# Patient Record
Sex: Male | Born: 1965 | Race: White | Hispanic: No | Marital: Single | Smoking: Current every day smoker
Health system: Southern US, Community
[De-identification: ages and names within clinical notes are randomized; demographics above are authoritative.]

## PROBLEM LIST (undated history)

## (undated) DIAGNOSIS — N486 Induration penis plastica: Secondary | ICD-10-CM

## (undated) DIAGNOSIS — K648 Other hemorrhoids: Secondary | ICD-10-CM

## (undated) DIAGNOSIS — E669 Obesity, unspecified: Secondary | ICD-10-CM

## (undated) DIAGNOSIS — I10 Essential (primary) hypertension: Secondary | ICD-10-CM

## (undated) DIAGNOSIS — N200 Calculus of kidney: Secondary | ICD-10-CM

## (undated) DIAGNOSIS — N400 Enlarged prostate without lower urinary tract symptoms: Secondary | ICD-10-CM

## (undated) DIAGNOSIS — E119 Type 2 diabetes mellitus without complications: Secondary | ICD-10-CM

## (undated) DIAGNOSIS — R197 Diarrhea, unspecified: Secondary | ICD-10-CM

## (undated) DIAGNOSIS — G4733 Obstructive sleep apnea (adult) (pediatric): Secondary | ICD-10-CM

## (undated) DIAGNOSIS — I700 Atherosclerosis of aorta: Secondary | ICD-10-CM

## (undated) DIAGNOSIS — K760 Fatty (change of) liver, not elsewhere classified: Secondary | ICD-10-CM

## (undated) DIAGNOSIS — K5790 Diverticulosis of intestine, part unspecified, without perforation or abscess without bleeding: Secondary | ICD-10-CM

## (undated) DIAGNOSIS — D126 Benign neoplasm of colon, unspecified: Secondary | ICD-10-CM

## (undated) HISTORY — PX: BILATERAL CARPAL TUNNEL RELEASE: SHX6508

## (undated) HISTORY — DX: Essential (primary) hypertension: I10

## (undated) HISTORY — DX: Other hemorrhoids: K64.8

## (undated) HISTORY — DX: Diverticulosis of intestine, part unspecified, without perforation or abscess without bleeding: K57.90

## (undated) HISTORY — DX: Benign neoplasm of colon, unspecified: D12.6

## (undated) HISTORY — DX: Atherosclerosis of aorta: I70.0

## (undated) HISTORY — DX: Calculus of kidney: N20.0

## (undated) HISTORY — DX: Benign prostatic hyperplasia without lower urinary tract symptoms: N40.0

---

## 2004-11-27 ENCOUNTER — Ambulatory Visit: Payer: Self-pay | Admitting: Family Medicine

## 2005-01-25 ENCOUNTER — Ambulatory Visit: Payer: Self-pay | Admitting: Family Medicine

## 2005-02-01 ENCOUNTER — Ambulatory Visit: Payer: Self-pay | Admitting: Family Medicine

## 2006-11-30 DIAGNOSIS — F411 Generalized anxiety disorder: Secondary | ICD-10-CM | POA: Insufficient documentation

## 2011-06-11 DIAGNOSIS — R197 Diarrhea, unspecified: Secondary | ICD-10-CM

## 2011-06-11 HISTORY — PX: COLONOSCOPY: SHX174

## 2011-06-11 HISTORY — DX: Diarrhea, unspecified: R19.7

## 2012-09-20 DIAGNOSIS — N486 Induration penis plastica: Secondary | ICD-10-CM | POA: Insufficient documentation

## 2013-04-12 DIAGNOSIS — G4733 Obstructive sleep apnea (adult) (pediatric): Secondary | ICD-10-CM

## 2013-04-12 HISTORY — DX: Obstructive sleep apnea (adult) (pediatric): G47.33

## 2015-01-25 ENCOUNTER — Ambulatory Visit (INDEPENDENT_AMBULATORY_CARE_PROVIDER_SITE_OTHER): Payer: 59 | Admitting: Family Medicine

## 2015-01-25 VITALS — BP 126/88 | HR 80 | Temp 98.3°F | Resp 16 | Ht 72.0 in | Wt 246.0 lb

## 2015-01-25 DIAGNOSIS — I10 Essential (primary) hypertension: Secondary | ICD-10-CM | POA: Insufficient documentation

## 2015-01-25 DIAGNOSIS — N529 Male erectile dysfunction, unspecified: Secondary | ICD-10-CM | POA: Insufficient documentation

## 2015-01-25 DIAGNOSIS — M5431 Sciatica, right side: Secondary | ICD-10-CM

## 2015-01-25 MED ORDER — OXYCODONE-ACETAMINOPHEN 5-325 MG PO TABS: 1.0000 | ORAL_TABLET | Freq: Three times a day (TID) | ORAL | Status: DC | PRN

## 2015-01-25 MED ORDER — PREDNISONE 20 MG PO TABS: 60.0000 mg | ORAL_TABLET | Freq: Every day | ORAL | Status: DC

## 2015-01-25 NOTE — Patient Instructions (Addendum)
Let me know if you're not improving by Monday afternoon    Sciatica Sciatica is pain, weakness, numbness, or tingling along the path of the sciatic nerve. The nerve starts in the lower back and runs down the back of each leg. The nerve controls the muscles in the lower leg and in the back of the knee, while also providing sensation to the back of the thigh, lower leg, and the sole of your foot. Sciatica is a symptom of another medical condition. For instance, nerve damage or certain conditions, such as a herniated disk or bone spur on the spine, pinch or put pressure on the sciatic nerve. This causes the pain, weakness, or other sensations normally associated with sciatica. Generally, sciatica only affects one side of the body. CAUSES   Herniated or slipped disc.  Degenerative disk disease.  A pain disorder involving the narrow muscle in the buttocks (piriformis syndrome).  Pelvic injury or fracture.  Pregnancy.  Tumor (rare). SYMPTOMS  Symptoms can vary from mild to very severe. The symptoms usually travel from the low back to the buttocks and down the back of the leg. Symptoms can include:  Mild tingling or dull aches in the lower back, leg, or hip.  Numbness in the back of the calf or sole of the foot.  Burning sensations in the lower back, leg, or hip.  Sharp pains in the lower back, leg, or hip.  Leg weakness.  Severe back pain inhibiting movement. These symptoms may get worse with coughing, sneezing, laughing, or prolonged sitting or standing. Also, being overweight may worsen symptoms. DIAGNOSIS  Your caregiver will perform a physical exam to look for common symptoms of sciatica. He or she may ask you to do certain movements or activities that would trigger sciatic nerve pain. Other tests may be performed to find the cause of the sciatica. These may include:  Blood tests.  X-rays.  Imaging tests, such as an MRI or CT scan. TREATMENT  Treatment is directed at the  cause of the sciatic pain. Sometimes, treatment is not necessary and the pain and discomfort goes away on its own. If treatment is needed, your caregiver may suggest:  Over-the-counter medicines to relieve pain.  Prescription medicines, such as anti-inflammatory medicine, muscle relaxants, or narcotics.  Applying heat or ice to the painful area.  Steroid injections to lessen pain, irritation, and inflammation around the nerve.  Reducing activity during periods of pain.  Exercising and stretching to strengthen your abdomen and improve flexibility of your spine. Your caregiver may suggest losing weight if the extra weight makes the back pain worse.  Physical therapy.  Surgery to eliminate what is pressing or pinching the nerve, such as a bone spur or part of a herniated disk. HOME CARE INSTRUCTIONS   Only take over-the-counter or prescription medicines for pain or discomfort as directed by your caregiver.  Apply ice to the affected area for 20 minutes, 3-4 times a day for the first 48-72 hours. Then try heat in the same way.  Exercise, stretch, or perform your usual activities if these do not aggravate your pain.  Attend physical therapy sessions as directed by your caregiver.  Keep all follow-up appointments as directed by your caregiver.  Do not wear high heels or shoes that do not provide proper support.  Check your mattress to see if it is too soft. A firm mattress may lessen your pain and discomfort. SEEK IMMEDIATE MEDICAL CARE IF:   You lose control of your bowel or  bladder (incontinence).  You have increasing weakness in the lower back, pelvis, buttocks, or legs.  You have redness or swelling of your back.  You have a burning sensation when you urinate.  You have pain that gets worse when you lie down or awakens you at night.  Your pain is worse than you have experienced in the past.  Your pain is lasting longer than 4 weeks.  You are suddenly losing weight  without reason. MAKE SURE YOU:  Understand these instructions.  Will watch your condition.  Will get help right away if you are not doing well or get worse.   This information is not intended to replace advice given to you by your health care provider. Make sure you discuss any questions you have with your health care provider.   Document Released: 03/23/2001 Document Revised: 12/18/2014 Document Reviewed: 08/08/2011 Elsevier Interactive Patient Education [employer].

## 2015-01-25 NOTE — Progress Notes (Signed)
@UMFCLOGO @  This chart was scribed for Robyn Haber, MD by Thea Alken, ED Scribe. This patient was seen in room 12 and the patient's care was started at 2:23 PM.  Patient ID: Steve Ritter MRN: [id number], DOB: 1965/06/07, 49 y.o. Date of Encounter: 01/25/2015, 2:23 PM  Primary Physician: No primary care provider on file.  Chief Complaint:  Chief Complaint  Patient presents with   Back Pain    x 10 days/ middle right lower back    HPI: 49 y.o. year old male with history below presents with right lower back pain. Pt states he was diagnosed with DDD that flares up every once in a while. 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 Since then, he's had gradually worsening right low back pain. He was seen by chiropractor 3 times this week without relief to pain. He has radiation of pain to right buttock and down right leg with occasional numbness. States he has not been able to sleep due to pain.    Past Medical History  Diagnosis Date   Hypertension      Home Meds: Prior to Admission medications   Medication Sig Start Date End Date Taking? Authorizing Provider  amLODipine (NORVASC) 5 MG tablet Take 5 mg by mouth daily.   Yes Historical Provider, MD  omeprazole (PRILOSEC) 10 MG capsule Take 10 mg by mouth daily.   Yes Historical Provider, MD  sildenafil (REVATIO) 20 MG tablet Take 20 mg by mouth 3 (three) times daily.   Yes Historical Provider, MD    Allergies:  Allergies  Allergen Reactions   Codeine    Erythromycin    Penicillins     Social History   Social History   Marital Status: Single    Spouse Name: N/A   Number of Children: N/A   Years of Education: N/A   Occupational History   Not on file.   Social History Main Topics   Smoking status: Former Smoker   Smokeless tobacco: Not on file   Alcohol Use: Not on file   Drug Use: Not on file   Sexual Activity: Not on file   Other Topics Concern   Not on  file   Social History Narrative   No narrative on file    Review of Systems: Constitutional: negative for chills, fever, night sweats, weight changes, or fatigue  HEENT: negative for vision changes, hearing loss, congestion, rhinorrhea, ST, epistaxis, or sinus pressure Cardiovascular: negative for chest pain or palpitations Respiratory: negative for hemoptysis, wheezing, shortness of breath, or cough Abdominal: negative for abdominal pain, nausea, vomiting, diarrhea, or constipation Dermatological: negative for rash Neurologic: negative for headache, dizziness, or syncope All other systems reviewed and are otherwise negative with the exception to those above and in the HPI.   Physical Exam: Blood pressure 126/88, pulse 80, temperature 98.3 F (36.8 C), temperature source Oral, resp. rate 16, height 6' (1.829 m), weight 246 lb (111.585 kg), SpO2 99 %., Body mass index is 33.36 kg/(m^2). General: Well developed, well nourished, in no acute distress. Head: Normocephalic, atraumatic, eyes without discharge, sclera non-icteric, nares are without discharge. Bilateral auditory canals clear, TM's are without perforation, pearly grey and translucent with reflective cone of light bilaterally. Oral cavity moist, posterior pharynx without exudate, erythema, peritonsillar abscess, or post nasal drip.  Neck: Supple. No thyromegaly. Full ROM. No lymphadenopathy. Lungs: Clear bilaterally to auscultation without wheezes, rales, or rhonchi. Breathing is unlabored. Heart:  RRR with S1 S2. No murmurs, rubs, or gallops appreciated. Abdomen: Soft, non-tender, non-distended with normoactive bowel sounds. No hepatomegaly. No rebound/guarding. No obvious abdominal masses. Msk:  Strength and tone normal for age. Extremities/Skin: Warm and dry. No clubbing or cyanosis. No edema. No rashes or suspicious lesions. Neuro: Alert and oriented X 3. Moves all extremities spontaneously. Gait is normal. CNII-XII grossly in  tact. Psych:  Responds to questions appropriately with a normal affect.  Positive straight leg raising on right, good reflexes in ankles and knees bilaterally, minimal tenderness in the right L5 lateral supra-sacral area   ASSESSMENT AND PLAN:  49 y.o. year old male with  This chart was scribed in my presence and reviewed by me personally.    ICD-9-CM ICD-10-CM   1. Sciatica of right side 724.3 M54.31 oxyCODONE-acetaminophen (ROXICET) 5-325 MG tablet     predniSONE (DELTASONE) 20 MG tablet  2. Essential hypertension 401.9 I10   3. Erectile dysfunction, unspecified erectile dysfunction type 607.84 N52.9      Signed, Robyn Haber, MD   Signed, Robyn Haber, MD 01/25/2015 2:23 PM

## 2015-08-11 DIAGNOSIS — K760 Fatty (change of) liver, not elsewhere classified: Secondary | ICD-10-CM

## 2015-08-11 DIAGNOSIS — E669 Obesity, unspecified: Secondary | ICD-10-CM

## 2015-08-11 HISTORY — DX: Obesity, unspecified: E66.9

## 2015-08-11 HISTORY — DX: Fatty (change of) liver, not elsewhere classified: K76.0

## 2015-08-17 ENCOUNTER — Inpatient Hospital Stay (HOSPITAL_COMMUNITY)
Admission: EM | Admit: 2015-08-17 | Discharge: 2015-08-22 | DRG: 439 | Disposition: A | Discharge status: Home / Self Care | Payer: 59 | Attending: Internal Medicine | Admitting: Internal Medicine

## 2015-08-17 ENCOUNTER — Encounter (HOSPITAL_COMMUNITY): Payer: Self-pay | Admitting: *Deleted

## 2015-08-17 ENCOUNTER — Emergency Department (HOSPITAL_COMMUNITY): Payer: 59

## 2015-08-17 DIAGNOSIS — E872 Acidosis: Secondary | ICD-10-CM | POA: Diagnosis present

## 2015-08-17 DIAGNOSIS — R1011 Right upper quadrant pain: Secondary | ICD-10-CM | POA: Diagnosis not present

## 2015-08-17 DIAGNOSIS — R739 Hyperglycemia, unspecified: Secondary | ICD-10-CM | POA: Diagnosis not present

## 2015-08-17 DIAGNOSIS — G4730 Sleep apnea, unspecified: Secondary | ICD-10-CM | POA: Diagnosis present

## 2015-08-17 DIAGNOSIS — K5900 Constipation, unspecified: Secondary | ICD-10-CM | POA: Diagnosis present

## 2015-08-17 DIAGNOSIS — R933 Abnormal findings on diagnostic imaging of other parts of digestive tract: Secondary | ICD-10-CM | POA: Diagnosis not present

## 2015-08-17 DIAGNOSIS — Z7982 Long term (current) use of aspirin: Secondary | ICD-10-CM | POA: Diagnosis not present

## 2015-08-17 DIAGNOSIS — K8500 Idiopathic acute pancreatitis without necrosis or infection: Secondary | ICD-10-CM | POA: Diagnosis not present

## 2015-08-17 DIAGNOSIS — R509 Fever, unspecified: Secondary | ICD-10-CM | POA: Diagnosis not present

## 2015-08-17 DIAGNOSIS — R791 Abnormal coagulation profile: Secondary | ICD-10-CM | POA: Diagnosis not present

## 2015-08-17 DIAGNOSIS — R109 Unspecified abdominal pain: Secondary | ICD-10-CM | POA: Diagnosis present

## 2015-08-17 DIAGNOSIS — Z8249 Family history of ischemic heart disease and other diseases of the circulatory system: Secondary | ICD-10-CM | POA: Diagnosis not present

## 2015-08-17 DIAGNOSIS — R945 Abnormal results of liver function studies: Secondary | ICD-10-CM

## 2015-08-17 DIAGNOSIS — Z6832 Body mass index (BMI) 32.0-32.9, adult: Secondary | ICD-10-CM | POA: Diagnosis not present

## 2015-08-17 DIAGNOSIS — K859 Acute pancreatitis without necrosis or infection, unspecified: Principal | ICD-10-CM

## 2015-08-17 DIAGNOSIS — K2980 Duodenitis without bleeding: Secondary | ICD-10-CM | POA: Diagnosis not present

## 2015-08-17 DIAGNOSIS — E669 Obesity, unspecified: Secondary | ICD-10-CM | POA: Diagnosis present

## 2015-08-17 DIAGNOSIS — E11649 Type 2 diabetes mellitus with hypoglycemia without coma: Secondary | ICD-10-CM | POA: Diagnosis present

## 2015-08-17 DIAGNOSIS — Z833 Family history of diabetes mellitus: Secondary | ICD-10-CM

## 2015-08-17 DIAGNOSIS — E1310 Other specified diabetes mellitus with ketoacidosis without coma: Secondary | ICD-10-CM | POA: Diagnosis not present

## 2015-08-17 DIAGNOSIS — Z87891 Personal history of nicotine dependence: Secondary | ICD-10-CM

## 2015-08-17 DIAGNOSIS — R7989 Other specified abnormal findings of blood chemistry: Secondary | ICD-10-CM

## 2015-08-17 DIAGNOSIS — E1165 Type 2 diabetes mellitus with hyperglycemia: Secondary | ICD-10-CM | POA: Diagnosis present

## 2015-08-17 DIAGNOSIS — R1030 Lower abdominal pain, unspecified: Secondary | ICD-10-CM | POA: Diagnosis not present

## 2015-08-17 DIAGNOSIS — I10 Essential (primary) hypertension: Secondary | ICD-10-CM | POA: Diagnosis present

## 2015-08-17 HISTORY — DX: Induration penis plastica: N48.6

## 2015-08-17 HISTORY — DX: Obstructive sleep apnea (adult) (pediatric): G47.33

## 2015-08-17 HISTORY — DX: Fatty (change of) liver, not elsewhere classified: K76.0

## 2015-08-17 HISTORY — DX: Obesity, unspecified: E66.9

## 2015-08-17 HISTORY — DX: Diarrhea, unspecified: R19.7

## 2015-08-17 LAB — URINALYSIS, ROUTINE W REFLEX MICROSCOPIC
Glucose, UA: 500 mg/dL — AB
Ketones, ur: >80 mg/dL — AB
Leukocytes, UA: NEGATIVE
Nitrite: NEGATIVE
Protein, ur: NEGATIVE mg/dL
Specific Gravity, Urine: 1.018 (ref 1.005–1.030)
pH: 6 (ref 5.0–8.0)

## 2015-08-17 LAB — COMPREHENSIVE METABOLIC PANEL
ALT: 50 U/L (ref 17–63)
AST: 46 U/L — ABNORMAL HIGH (ref 15–41)
Albumin: 3.3 g/dL — ABNORMAL LOW (ref 3.5–5.0)
Alkaline Phosphatase: 69 U/L (ref 38–126)
Anion gap: 10 (ref 5–15)
BUN: 12 mg/dL (ref 6–20)
CO2: 20 mmol/L — ABNORMAL LOW (ref 22–32)
Calcium: 8.4 mg/dL — ABNORMAL LOW (ref 8.9–10.3)
Chloride: 102 mmol/L (ref 101–111)
Creatinine, Ser: 0.77 mg/dL (ref 0.61–1.24)
GFR calc Af Amer: >60 mL/min (ref >60)
GFR calc non Af Amer: >60 mL/min (ref >60)
Glucose, Bld: 168 mg/dL — ABNORMAL HIGH (ref 65–99)
Potassium: 4.6 mmol/L (ref 3.5–5.1)
Sodium: 132 mmol/L — ABNORMAL LOW (ref 135–145)
Total Bilirubin: 2.8 mg/dL — ABNORMAL HIGH (ref 0.3–1.2)
Total Protein: 6 g/dL — ABNORMAL LOW (ref 6.5–8.1)

## 2015-08-17 LAB — URINE MICROSCOPIC-ADD ON

## 2015-08-17 LAB — CBC
HCT: 45.2 % (ref 39.0–52.0)
Hemoglobin: 16.7 g/dL (ref 13.0–17.0)
MCH: 33.5 pg (ref 26.0–34.0)
MCHC: 36.9 g/dL — ABNORMAL HIGH (ref 30.0–36.0)
MCV: 90.8 fL (ref 78.0–100.0)
Platelets: 164 10*3/uL (ref 150–400)
RBC: 4.98 MIL/uL (ref 4.22–5.81)
RDW: 12 % (ref 11.5–15.5)
WBC: 14.6 10*3/uL — ABNORMAL HIGH (ref 4.0–10.5)

## 2015-08-17 LAB — LIPASE, BLOOD: Lipase: 36 U/L (ref 11–51)

## 2015-08-17 LAB — TROPONIN I: Troponin I: <0.03 ng/mL (ref <0.031)

## 2015-08-17 MED ORDER — SODIUM CHLORIDE 0.9 % IV BOLUS (SEPSIS)
1000.0000 mL | Freq: Once | INTRAVENOUS | Status: AC
  Administered 2015-08-17: 1000 mL via INTRAVENOUS

## 2015-08-17 MED ORDER — ACETAMINOPHEN 325 MG PO TABS
650.0000 mg | ORAL_TABLET | Freq: Four times a day (QID) | ORAL | Status: DC | PRN
  Administered 2015-08-18 – 2015-08-20 (×6): 650 mg via ORAL
  Filled 2015-08-17 (×6): qty 2

## 2015-08-17 MED ORDER — SODIUM CHLORIDE 0.9 % IV SOLN
80.0000 mg | Freq: Once | INTRAVENOUS | Status: AC
  Administered 2015-08-17: 80 mg via INTRAVENOUS
  Filled 2015-08-17: qty 80

## 2015-08-17 MED ORDER — HYDROMORPHONE HCL 1 MG/ML IJ SOLN
1.0000 mg | Freq: Once | INTRAMUSCULAR | Status: AC
  Administered 2015-08-17: 1 mg via INTRAVENOUS
  Filled 2015-08-17: qty 1

## 2015-08-17 MED ORDER — ENOXAPARIN SODIUM 40 MG/0.4ML ~~LOC~~ SOLN
40.0000 mg | SUBCUTANEOUS | Status: DC
  Administered 2015-08-18 – 2015-08-21 (×4): 40 mg via SUBCUTANEOUS
  Filled 2015-08-17 (×5): qty 0.4

## 2015-08-17 MED ORDER — ONDANSETRON HCL 4 MG/2ML IJ SOLN
4.0000 mg | Freq: Four times a day (QID) | INTRAMUSCULAR | Status: DC | PRN
  Administered 2015-08-18: 4 mg via INTRAVENOUS
  Filled 2015-08-17: qty 2

## 2015-08-17 MED ORDER — PANTOPRAZOLE SODIUM 40 MG IV SOLR
40.0000 mg | Freq: Two times a day (BID) | INTRAVENOUS | Status: DC
  Administered 2015-08-17 – 2015-08-19 (×5): 40 mg via INTRAVENOUS
  Filled 2015-08-17 (×7): qty 40

## 2015-08-17 MED ORDER — MORPHINE SULFATE (PF) 2 MG/ML IV SOLN
2.0000 mg | INTRAVENOUS | Status: DC | PRN
  Administered 2015-08-18: 2 mg via INTRAVENOUS
  Filled 2015-08-17: qty 1

## 2015-08-17 MED ORDER — ONDANSETRON HCL 4 MG/2ML IJ SOLN
4.0000 mg | Freq: Once | INTRAMUSCULAR | Status: AC
  Administered 2015-08-17: 4 mg via INTRAVENOUS
  Filled 2015-08-17: qty 2

## 2015-08-17 MED ORDER — IOPAMIDOL (ISOVUE-300) INJECTION 61%
100.0000 mL | Freq: Once | INTRAVENOUS | Status: AC | PRN
  Administered 2015-08-17: 100 mL via INTRAVENOUS

## 2015-08-17 MED ORDER — ONDANSETRON HCL 4 MG PO TABS: 4.0000 mg | ORAL_TABLET | Freq: Four times a day (QID) | ORAL | Status: DC | PRN

## 2015-08-17 MED ORDER — ACETAMINOPHEN 650 MG RE SUPP: 650.0000 mg | Freq: Four times a day (QID) | RECTAL | Status: DC | PRN

## 2015-08-17 MED ORDER — MORPHINE SULFATE (PF) 2 MG/ML IV SOLN
2.0000 mg | Freq: Once | INTRAVENOUS | Status: AC
  Administered 2015-08-17: 2 mg via INTRAVENOUS
  Filled 2015-08-17: qty 1

## 2015-08-17 MED ORDER — HYDRALAZINE HCL 20 MG/ML IJ SOLN: 10.0000 mg | INTRAMUSCULAR | Status: DC | PRN

## 2015-08-17 MED ORDER — DIATRIZOATE MEGLUMINE & SODIUM 66-10 % PO SOLN
30.0000 mL | Freq: Once | ORAL | Status: AC
  Administered 2015-08-17: 30 mL via ORAL

## 2015-08-17 MED ORDER — SODIUM CHLORIDE 0.9 % IV SOLN
INTRAVENOUS | Status: AC
  Administered 2015-08-17: via INTRAVENOUS

## 2015-08-17 NOTE — ED Notes (Signed)
Nurse at bedside collecting labs 

## 2015-08-17 NOTE — ED Notes (Signed)
Patient transported to CT 

## 2015-08-17 NOTE — H&P (Addendum)
History and Physical    Steve Ritter [id number] DOB: December 26, 1965 DOA: 08/17/2015  Referring MD/NP/PA: Dr.Kohut. PCP: No primary care provider on file.  Outpatient Specialists: At[facility]. Patient coming from: Home.  Chief Complaint: Abdominal pain.  HPI: Steve Ritter is a 50 y.o. male with medical history significant of hypertension, sleep apnea, Peyronie's disease presents to the ER because of abdominal pain. Patient has any abdominal pain mostly in the lower quadrants over the last 3 days. Denies any nausea vomiting or diarrhea. Patient's pain is constant and dull aching. Has no relation to food. Denies any new medications or sick contacts. Patient drinks alcohol very occasionally. In the ER labs revealed mildly elevated total bilirubin and CT of the abdomen and pelvis show features concerning for inflammatory changes involving the upper abdomen concerning for duodenitis versus pancreatitis. Lipase was normal. EKG was showing normal sinus rhythm with negative troponin. Patient has been admitted for further management of abdominal pain with possibilities including duodenitis versus pancreatitis.   ED Course:  Was started on IV fluids. IV Protonix.  Review of Systems: As per HPI otherwise 10 point review of systems negative.    Past Medical History  Diagnosis Date  . Hypertension     Past Surgical History  Procedure Laterality Date  . Bilateral carpal tunnel release Bilateral      reports that he has quit smoking. He does not have any smokeless tobacco history on file. His alcohol and drug histories are not on file.  Allergies  Allergen Reactions  . Codeine   . Erythromycin   . Penicillins     Family History  Problem Relation Age of Onset  . Hypertension Mother   . Diabetes Father   . Heart disease Father   . Hypertension Father   . Stroke Father   . Mental retardation Father     Prior to Admission medications   Medication Sig Start Date End Date Taking?  Authorizing Provider  ALPRAZolam Duanne Moron) 0.5 MG tablet Take 1 tablet by mouth as needed. anxiety 07/28/15  Yes Historical Provider, MD  amLODipine (NORVASC) 5 MG tablet Take 5 mg by mouth daily.   Yes Historical Provider, MD  aspirin EC 81 MG tablet Take 81 mg by mouth.   Yes Historical Provider, MD  omeprazole (PRILOSEC) 10 MG capsule Take 10 mg by mouth daily.   Yes Historical Provider, MD  oxyCODONE-acetaminophen (ROXICET) 5-325 MG tablet Take 1 tablet by mouth every 8 (eight) hours as needed for severe pain. 01/25/15  Yes Robyn Haber, MD  polyethylene glycol [facility] / GLYCOLAX) packet Take 1 packet by mouth daily.   Yes Historical Provider, MD  sildenafil (REVATIO) 20 MG tablet Take 20 mg by mouth 3 (three) times daily.   Yes Historical Provider, MD  varenicline (CHANTIX CONTINUING MONTH PAK) 1 MG tablet Take 1 tablet by mouth daily. 09/08/12  Yes Historical Provider, MD  vitamin E 100 UNIT capsule Take by mouth.   Yes Historical Provider, MD  predniSONE (DELTASONE) 20 MG tablet Take 3 tablets (60 mg total) by mouth daily with breakfast. For 3 days, then 2 daily for three days, then 1 daily 01/25/15   Robyn Haber, MD    Physical Exam: Filed Vitals:   08/17/15 2115 08/17/15 2130 08/17/15 2230 08/17/15 2255  BP:  124/81 122/79 128/79  Pulse: 87 88 92 89  Temp:    98.9 F (37.2 C)  TempSrc:    Oral  Resp:   25 20  SpO2: 93% 93%  90% 95%      Constitutional: Appears normal. Filed Vitals:   08/17/15 2115 08/17/15 2130 08/17/15 2230 08/17/15 2255  BP:  124/81 122/79 128/79  Pulse: 87 88 92 89  Temp:    98.9 F (37.2 C)  TempSrc:    Oral  Resp:   25 20  SpO2: 93% 93% 90% 95%   Eyes: Anicteric no pallor. ENMT: No discharge from the ears eyes nose or mouth. Neck: No mass felt. No neck rigidity. Respiratory: No rhonchi or crepitations. Cardiovascular: S1 and S2 heard. Abdomen: Soft nontender bowel sounds present. No guarding or rigidity. Musculoskeletal: No edema. Skin:  No rash. Neurologic: Alert awake oriented to time place and person. Moves all extremities. Psychiatric: Appears normal.   Labs on Admission: I have personally reviewed following labs and imaging studies  CBC:  Recent Labs Lab 08/17/15 1720  WBC 14.6*  HGB 16.7  HCT 45.2  MCV 90.8  PLT 123456   Basic Metabolic Panel:  Recent Labs Lab 08/17/15 1752  NA 132*  K 4.6  CL 102  CO2 20*  GLUCOSE 168*  BUN 12  CREATININE 0.77  CALCIUM 8.4*   GFR: CrCl cannot be calculated (Unknown ideal weight.). Liver Function Tests:  Recent Labs Lab 08/17/15 1752  AST 46*  ALT 50  ALKPHOS 69  BILITOT 2.8*  PROT 6.0*  ALBUMIN 3.3*    Recent Labs Lab 08/17/15 1752  LIPASE 36   No results for input(s): AMMONIA in the last 168 hours. Coagulation Profile: No results for input(s): INR, PROTIME in the last 168 hours. Cardiac Enzymes:  Recent Labs Lab 08/17/15 2145  TROPONINI <0.03   BNP (last 3 results) No results for input(s): PROBNP in the last 8760 hours. HbA1C: No results for input(s): HGBA1C in the last 72 hours. CBG: No results for input(s): GLUCAP in the last 168 hours. Lipid Profile: No results for input(s): CHOL, HDL, LDLCALC, TRIG, CHOLHDL, LDLDIRECT in the last 72 hours. Thyroid Function Tests: No results for input(s): TSH, T4TOTAL, FREET4, T3FREE, THYROIDAB in the last 72 hours. Anemia Panel: No results for input(s): VITAMINB12, FOLATE, FERRITIN, TIBC, IRON, RETICCTPCT in the last 72 hours. Urine analysis:    Component Value Date/Time   COLORURINE AMBER* 08/17/2015 1821   APPEARANCEUR CLOUDY* 08/17/2015 1821   LABSPEC 1.018 08/17/2015 1821   PHURINE 6.0 08/17/2015 1821   GLUCOSEU 500* 08/17/2015 1821   HGBUR TRACE* 08/17/2015 1821   BILIRUBINUR SMALL* 08/17/2015 1821   KETONESUR >80* 08/17/2015 1821   PROTEINUR NEGATIVE 08/17/2015 1821   NITRITE NEGATIVE 08/17/2015 1821   LEUKOCYTESUR NEGATIVE 08/17/2015 1821   Sepsis  Labs: @LABRCNTIP (procalcitonin:4,lacticidven:4) )No results found for this or any previous visit (from the past 240 hour(s)).   Radiological Exams on Admission: Ct Abdomen Pelvis W Contrast  08/17/2015  CLINICAL DATA:  50 year old male with right lower quadrant abdominal pain. Concern for appendicitis. EXAM: CT ABDOMEN AND PELVIS WITH CONTRAST TECHNIQUE: Multidetector CT imaging of the abdomen and pelvis was performed using the standard protocol following bolus administration of intravenous contrast. CONTRAST:  140mL ISOVUE-300 IOPAMIDOL (ISOVUE-300) INJECTION 61% COMPARISON:  Lumbar spine MRI dated 02/13/2015 FINDINGS: The visualized lung bases are clear. Is no intra-abdominal free air. Trace free fluid may be present within pelvis. There is diffuse hepatic steatosis with foci of fatty sparring the adjacent the gallbladder. Set the gallbladder appears unremarkable. There is inflammatory fluid centers in the epigastric area surrounding the pancreas and duodenal C-loop extending into the lower abdomen and pelvis. Sign this may  be related to acute pancreatitis or duodenitis. Correlation with clinical exam and pancreatic enzymes recommended. There is no drainable fluid collection/abscess or pseudocyst. No pancreatic duct dilatation stop or gland atrophy. The spleen, adrenal glands appear unremarkable. There is a punctate nonobstructing left renal inferior pole calculus. There is a 4 mm nonobstructing right renal inferior pole calculus. No hydronephrosis on either side. The visualized ureters and urinary bladder appear unremarkable. The prostate, and seminal vesicles are grossly unremarkable. Moderate stool noted throughout the colon. There is no evidence of bowel obstruction. Normal appendix. The abdominal aorta and IVC appear unremarkable. No portal venous gas identified. There is no adenopathy. The origins of the celiac axis, SMA, IMA as well as the origins of the renal arteries are patent. There is a small fat  containing umbilical hernia. The abdominal wall soft tissues appear unremarkable. [age] there is mild degenerative changes of the spine. No acute fracture. IMPRESSION: Inflammatory changes of the upper abdomen involving the pancreas and duodenal C-loop with extension of the inflammatory fluid into the lower abdomen. Findings represent acute pancreatitis or duodenitis. Correlation with clinical exam and pancreatic enzymes recommended. No evidence of bowel obstruction.  Normal appendix. A 4 mm nonobstructing right renal inferior pole calculus. Fatty liver. Electronically Signed   By: Anner Crete M.D.   On: 08/17/2015 19:13    EKG: Independently reviewed. Normal sinus rhythm.  Assessment/Plan Principal Problem:   Abdominal pain Active Problems:   Essential hypertension   Duodenitis    #1. Abdominal pain with inflammatory changes in the upper abdomen concerning for duodenitis versus pancreatitis - at this time I will keep patient nothing by mouth and gently hydrate. Since patient's total bilirubin is elevated we will recheck liver function test to see if it is direct or indirect bilirubin. Recheck labs including lipase in a.m. Gallbladder was unremarkable in the CAT scan. If pain persists will get sonogram of the abdomen. Since there is also concern for duodenitis have placed patient on Protonix IV.  #2. Hypertension - since patient is nothing by mouth I have placed patient on when necessary IV hydralazine. #3. Hyperglycemia - check hemoglobin A1c. #4. Sleep apnea on CPAP at bedtime. #5. History of Peyronie's disease - no acute issues.  Patient does have metabolic acidosis hydrate and check metabolic panel.   Addendum - at around 5 AM 08/18/2015 patient started spiking fever 102F. I have ordered blood cultures chest x-rays lactic acid levels procalcitonin levels and placed patient on empiric antibiotics. I have also order 1 L fluid bolus.   DVT prophylaxis: Lovenox. Code Status: Full  code.  Family Communication: No family at the bedside.  Disposition Plan: Home.  Consults called: None.  Admission status: Inpatient. MedSurg. Likely stay 2-3 days.    Rise Patience MD [facility] Pager [phone].  If 7PM-7AM, please contact night-coverage [email or web address] Password [email or web address]  08/17/2015, 11:21 PM

## 2015-08-17 NOTE — ED Notes (Signed)
Patient aware that urine sample is needed. Patient unable to void at this time.

## 2015-08-17 NOTE — ED Notes (Signed)
Pt sent from [facility] for RLQ abdominal pain since Friday. Pt denies n/v/d. Pt states he has also had fever and dark urine.

## 2015-08-17 NOTE — ED Provider Notes (Signed)
CSN: [id number]     Arrival date & time 08/17/15  1652 History   First MD Initiated Contact with Patient 08/17/15 1657     Chief Complaint  Patient presents with  . Abdominal Pain     (Consider location/radiation/quality/duration/timing/severity/associated sxs/prior Treatment) HPI   50 year old male with abdominal pain. Right lower quadrant. Gradual onset and Friday. Constant and progressive since then. Does not radiate. Worse with movement. Mild nausea, but no vomiting. No diarrhea. Anorexia. Subjective fever. Urine has been dark in color but otherwise no specific urinary complaints. Denies past abdominal surgical history. No history of similar type symptoms.  Past Medical History  Diagnosis Date  . Hypertension    Past Surgical History  Procedure Laterality Date  . Bilateral carpal tunnel release Bilateral    Family History  Problem Relation Age of Onset  . Hypertension Mother   . Diabetes Father   . Heart disease Father   . Hypertension Father   . Stroke Father   . Mental retardation Father    Social History  Substance Use Topics  . Smoking status: Former Research scientist (life sciences)  . Smokeless tobacco: None  . Alcohol Use: None    Review of Systems  All systems reviewed and negative, other than as noted in HPI.   Allergies  Codeine; Erythromycin; and Penicillins  Home Medications   Prior to Admission medications   Medication Sig Start Date End Date Taking? Authorizing Provider  amLODipine (NORVASC) 5 MG tablet Take 5 mg by mouth daily.    Historical Provider, MD  omeprazole (PRILOSEC) 10 MG capsule Take 10 mg by mouth daily.    Historical Provider, MD  oxyCODONE-acetaminophen (ROXICET) 5-325 MG tablet Take 1 tablet by mouth every 8 (eight) hours as needed for severe pain. 01/25/15   Robyn Haber, MD  predniSONE (DELTASONE) 20 MG tablet Take 3 tablets (60 mg total) by mouth daily with breakfast. For 3 days, then 2 daily for three days, then 1 daily 01/25/15   Robyn Haber, MD   sildenafil (REVATIO) 20 MG tablet Take 20 mg by mouth 3 (three) times daily.    Historical Provider, MD   BP 142/90 mmHg  Pulse 102  Temp(Src) 98.4 F (36.9 C) (Oral)  Resp 18  SpO2 96% Physical Exam  Constitutional: He appears well-developed and well-nourished. No distress.  HENT:  Head: Normocephalic and atraumatic.  Eyes: Conjunctivae are normal. Right eye exhibits no discharge. Left eye exhibits no discharge.  Neck: Neck supple.  Cardiovascular: Regular rhythm and normal heart sounds.  Exam reveals no gallop and no friction rub.   No murmur heard. Mild tachycardia  Pulmonary/Chest: Effort normal and breath sounds normal. No respiratory distress.  Abdominal: Soft. He exhibits no distension. There is tenderness.  Right upper and lower quadrant tenderness with voluntary guarding. No rebound. No distention.  Genitourinary:  No CVA tenderness  Musculoskeletal: He exhibits no edema or tenderness.  Neurological: He is alert.  Skin: Skin is warm and dry.  Psychiatric: He has a normal mood and affect. His behavior is normal. Thought content normal.  Nursing note and vitals reviewed.   ED Course  Procedures (including critical care time) Labs Review Labs Reviewed  CBC - Abnormal; Notable for the following:    WBC 14.6 (*)    MCHC 36.9 (*)    All other components within normal limits  URINALYSIS, ROUTINE W REFLEX MICROSCOPIC (NOT AT [facility])  COMPREHENSIVE METABOLIC PANEL  LIPASE, BLOOD    Imaging Review Ct Abdomen Pelvis W Contrast  08/17/2015  CLINICAL DATA:  50 year old male with right lower quadrant abdominal pain. Concern for appendicitis. EXAM: CT ABDOMEN AND PELVIS WITH CONTRAST TECHNIQUE: Multidetector CT imaging of the abdomen and pelvis was performed using the standard protocol following bolus administration of intravenous contrast. CONTRAST:  142mL ISOVUE-300 IOPAMIDOL (ISOVUE-300) INJECTION 61% COMPARISON:  Lumbar spine MRI dated 02/13/2015 FINDINGS: The visualized lung  bases are clear. Is no intra-abdominal free air. Trace free fluid may be present within pelvis. There is diffuse hepatic steatosis with foci of fatty sparring the adjacent the gallbladder. Set the gallbladder appears unremarkable. There is inflammatory fluid centers in the epigastric area surrounding the pancreas and duodenal C-loop extending into the lower abdomen and pelvis. Sign this may be related to acute pancreatitis or duodenitis. Correlation with clinical exam and pancreatic enzymes recommended. There is no drainable fluid collection/abscess or pseudocyst. No pancreatic duct dilatation stop or gland atrophy. The spleen, adrenal glands appear unremarkable. There is a punctate nonobstructing left renal inferior pole calculus. There is a 4 mm nonobstructing right renal inferior pole calculus. No hydronephrosis on either side. The visualized ureters and urinary bladder appear unremarkable. The prostate, and seminal vesicles are grossly unremarkable. Moderate stool noted throughout the colon. There is no evidence of bowel obstruction. Normal appendix. The abdominal aorta and IVC appear unremarkable. No portal venous gas identified. There is no adenopathy. The origins of the celiac axis, SMA, IMA as well as the origins of the renal arteries are patent. There is a small fat containing umbilical hernia. The abdominal wall soft tissues appear unremarkable. Fifty there is mild degenerative changes of the spine. No acute fracture. IMPRESSION: Inflammatory changes of the upper abdomen involving the pancreas and duodenal C-loop with extension of the inflammatory fluid into the lower abdomen. Findings represent acute pancreatitis or duodenitis. Correlation with clinical exam and pancreatic enzymes recommended. No evidence of bowel obstruction.  Normal appendix. A 4 mm nonobstructing right renal inferior pole calculus. Fatty liver. Electronically Signed   By: Anner Crete M.D.   On: 08/17/2015 19:13   I have  personally reviewed and evaluated these images and lab results as part of my medical decision-making.   EKG Interpretation None      MDM   Final diagnoses:  Right lower quadrant abdominal pain    50 year old male with abdominal pain. May be appendicitis. Nothing by mouth. IV fluids and pain medications. Basic labs and CT the abdomen and pelvis.  Imaging as above. Lipase normal. Likely duodenitis. No previous diagnosis of PUD. Has been taking up to 1,000mg  of ibuprofen several times per week for the past several months for back pain. Given multiple doses of pain medication, antiemetic and protonix. Symptoms not well controlled. Will admit.   Virgel Manifold, MD 08/21/15 1254

## 2015-08-18 ENCOUNTER — Inpatient Hospital Stay (HOSPITAL_COMMUNITY): Payer: 59

## 2015-08-18 ENCOUNTER — Encounter (HOSPITAL_COMMUNITY): Payer: Self-pay | Admitting: *Deleted

## 2015-08-18 DIAGNOSIS — R739 Hyperglycemia, unspecified: Secondary | ICD-10-CM

## 2015-08-18 DIAGNOSIS — R1030 Lower abdominal pain, unspecified: Secondary | ICD-10-CM

## 2015-08-18 LAB — BASIC METABOLIC PANEL
Anion gap: 9 (ref 5–15)
BUN: 11 mg/dL (ref 6–20)
CHLORIDE: 102 mmol/L (ref 101–111)
CO2: 22 mmol/L (ref 22–32)
CREATININE: 0.87 mg/dL (ref 0.61–1.24)
Calcium: 8.5 mg/dL — ABNORMAL LOW (ref 8.9–10.3)
GFR calc Af Amer: >60 mL/min (ref >60)
GFR calc non Af Amer: >60 mL/min (ref >60)
Glucose, Bld: 257 mg/dL — ABNORMAL HIGH (ref 65–99)
POTASSIUM: 4 mmol/L (ref 3.5–5.1)
Sodium: 133 mmol/L — ABNORMAL LOW (ref 135–145)

## 2015-08-18 LAB — CBC
HEMATOCRIT: 39.7 % (ref 39.0–52.0)
Hemoglobin: 13.9 g/dL (ref 13.0–17.0)
MCH: 32.2 pg (ref 26.0–34.0)
MCHC: 35 g/dL (ref 30.0–36.0)
MCV: 91.9 fL (ref 78.0–100.0)
PLATELETS: 143 10*3/uL — AB (ref 150–400)
RBC: 4.32 MIL/uL (ref 4.22–5.81)
RDW: 12.1 % (ref 11.5–15.5)
WBC: 11.7 10*3/uL — ABNORMAL HIGH (ref 4.0–10.5)

## 2015-08-18 LAB — HEPATIC FUNCTION PANEL
ALK PHOS: 63 U/L (ref 38–126)
ALT: 30 U/L (ref 17–63)
ALT: 32 U/L (ref 17–63)
AST: 17 U/L (ref 15–41)
AST: 18 U/L (ref 15–41)
Albumin: 3 g/dL — ABNORMAL LOW (ref 3.5–5.0)
Albumin: 3.1 g/dL — ABNORMAL LOW (ref 3.5–5.0)
Alkaline Phosphatase: 65 U/L (ref 38–126)
BILIRUBIN DIRECT: 0.2 mg/dL (ref 0.1–0.5)
BILIRUBIN DIRECT: 0.4 mg/dL (ref 0.1–0.5)
BILIRUBIN INDIRECT: 0.9 mg/dL (ref 0.3–0.9)
BILIRUBIN TOTAL: 1.1 mg/dL (ref 0.3–1.2)
BILIRUBIN TOTAL: 1.4 mg/dL — AB (ref 0.3–1.2)
Indirect Bilirubin: 1 mg/dL — ABNORMAL HIGH (ref 0.3–0.9)
Total Protein: 5.8 g/dL — ABNORMAL LOW (ref 6.5–8.1)
Total Protein: 6.1 g/dL — ABNORMAL LOW (ref 6.5–8.1)

## 2015-08-18 LAB — LACTIC ACID, PLASMA: LACTIC ACID, VENOUS: 0.9 mmol/L (ref 0.5–2.0)

## 2015-08-18 LAB — LIPASE, BLOOD: Lipase: 32 U/L (ref 11–51)

## 2015-08-18 LAB — PROCALCITONIN: PROCALCITONIN: 0.27 ng/mL

## 2015-08-18 MED ORDER — CYCLOBENZAPRINE HCL 10 MG PO TABS
10.0000 mg | ORAL_TABLET | Freq: Once | ORAL | Status: AC
  Administered 2015-08-18: 10 mg via ORAL
  Filled 2015-08-18: qty 1

## 2015-08-18 MED ORDER — SODIUM CHLORIDE 0.9 % IV SOLN
500.0000 mg | Freq: Four times a day (QID) | INTRAVENOUS | Status: DC
  Administered 2015-08-18 (×2): 500 mg via INTRAVENOUS
  Filled 2015-08-18 (×2): qty 500

## 2015-08-18 MED ORDER — VANCOMYCIN HCL 10 G IV SOLR
2000.0000 mg | Freq: Once | INTRAVENOUS | Status: AC
  Administered 2015-08-18: 2000 mg via INTRAVENOUS
  Filled 2015-08-18: qty 2000

## 2015-08-18 MED ORDER — METRONIDAZOLE IN NACL 5-0.79 MG/ML-% IV SOLN
500.0000 mg | Freq: Three times a day (TID) | INTRAVENOUS | Status: DC
Start: 2015-08-18 — End: 2015-08-21
  Administered 2015-08-18 – 2015-08-21 (×9): 500 mg via INTRAVENOUS
  Filled 2015-08-18 (×9): qty 100

## 2015-08-18 MED ORDER — CIPROFLOXACIN IN D5W 400 MG/200ML IV SOLN
400.0000 mg | Freq: Two times a day (BID) | INTRAVENOUS | Status: DC
  Administered 2015-08-18 – 2015-08-21 (×6): 400 mg via INTRAVENOUS
  Filled 2015-08-18 (×6): qty 200

## 2015-08-18 MED ORDER — MORPHINE SULFATE (PF) 2 MG/ML IV SOLN
2.0000 mg | INTRAVENOUS | Status: DC | PRN
  Administered 2015-08-18 (×5): 4 mg via INTRAVENOUS
  Administered 2015-08-19 (×5): 2 mg via INTRAVENOUS
  Administered 2015-08-20 (×3): 4 mg via INTRAVENOUS
  Administered 2015-08-20: 2 mg via INTRAVENOUS
  Administered 2015-08-21 (×2): 4 mg via INTRAVENOUS
  Administered 2015-08-22: 2 mg via INTRAVENOUS
  Filled 2015-08-18 (×2): qty 2
  Filled 2015-08-18 (×2): qty 1
  Filled 2015-08-18 (×2): qty 2
  Filled 2015-08-18: qty 1
  Filled 2015-08-18 (×2): qty 2
  Filled 2015-08-18: qty 1
  Filled 2015-08-18 (×2): qty 2
  Filled 2015-08-18 (×3): qty 1
  Filled 2015-08-18 (×2): qty 2

## 2015-08-18 MED ORDER — VANCOMYCIN HCL IN DEXTROSE 1-5 GM/200ML-% IV SOLN
1000.0000 mg | Freq: Three times a day (TID) | INTRAVENOUS | Status: DC
Start: 2015-08-18 — End: 2015-08-18
  Filled 2015-08-18: qty 200

## 2015-08-18 MED ORDER — SODIUM CHLORIDE 0.9 % IV BOLUS (SEPSIS)
1000.0000 mL | Freq: Once | INTRAVENOUS | Status: AC
  Administered 2015-08-18: 1000 mL via INTRAVENOUS

## 2015-08-18 MED ORDER — MORPHINE SULFATE (PF) 2 MG/ML IV SOLN
1.0000 mg | Freq: Once | INTRAVENOUS | Status: AC
  Administered 2015-08-18: 1 mg via INTRAVENOUS
  Filled 2015-08-18: qty 1

## 2015-08-18 NOTE — Progress Notes (Signed)
Pt refused CPAP for qhs. RT instructed Pt to inform RN if he decides to use CPAP.

## 2015-08-18 NOTE — Progress Notes (Signed)
Pharmacy Antibiotic Note  Steve Ritter is a 50 y.o. male admitted on 08/17/2015 with Intra-abdominal infection.  Pharmacy has been consulted for Primaxin/Vancomycin dosing.  Plan: Primaxin 500mg  IV q6h Vancomycin 2Gm x1 then 1Gm IV q8h (VT 15-20 mg/L)     Temp (24hrs), Avg:99.9 F (37.7 C), Min:98.4 F (36.9 C), Max:102.3 F (39.1 C)   Recent Labs Lab 08/17/15 1720 08/17/15 1752 08/18/15 0437  WBC 14.6*  --  11.7*  CREATININE  --  0.77 0.87    CrCl cannot be calculated (Unknown ideal weight.).    Allergies  Allergen Reactions  . Codeine   . Erythromycin   . Penicillins     Antimicrobials this admission: 5/8 primaxin >>  5/8 vancomycin >>   Dose adjustments this admission:   Microbiology results:  BCx:   UCx:    Sputum:    MRSA PCR:   Thank you for allowing pharmacy to be a part of this patient's care.  Dorrene German 08/18/2015 5:59 AM

## 2015-08-18 NOTE — Progress Notes (Addendum)
PROGRESS NOTE  Steve Ritter [id number] DOB: [birth year] DOA: 08/17/2015 PCP: No primary care provider on file.  HPI/Recap of past 57 hours: 50 year old male past history of sleep apnea and essential hypertension admitted on 5/7 with 2 days of lower quadrant abdominal pain. No nausea or vomiting or diarrhea. CT scan noted inflammatory changes in the upper abdomen concerning for duodenitis versus pancreatitis. Lipase level normal. Patient also noted to have mild leukocytosis with white count of 14. Following admission, spiked temperature of 102. No evidence of sepsis. Started on IV antibiotics and fluids.  By this morning, white count down to 11. Patient was still some pain, although better controlled. Tolerating clear liquids. Still having some pain at times again in the lower quadrants.  Assessment/Plan: Principal Problem:   Duodenitis causing abdominal pain: Unclear etiology, likely infectious. Continue antibiotics. Slowly advancing diet Active Problems:   Essential hypertension: Blood pressure stable Sleep apnea: Stable Constipation: Noted on CT scan, as pain subsides, add bowel regimen  Hypoglycemia: No previous history of diabetes. Checking A1c.   Code Status: Full code   Family Communication: Left message for family   Disposition Plan: Anticipate discharge tomorrow    Consultants:  None   Procedures:   None  Antimicrobials:  IV Cipro and Flagyl: 5/18-present  IV cefepime and vancomycin 5/7-5/8  DVT prophylaxis:  Lovenox   Objective: Filed Vitals:   08/17/15 2230 08/17/15 2255 08/18/15 0534 08/18/15 0610  BP: 122/79 128/79 121/79   Pulse: 92 89 91   Temp:  98.9 F (37.2 C) 102.3 F (39.1 C)   TempSrc:  Oral Oral   Resp: 25 20 20    Height:    6' (1.829 m)  Weight:    106.731 kg (235 lb 4.8 oz)  SpO2: 90% 95% 95%     Intake/Output Summary (Last 24 hours) at 08/18/15 1333 Last data filed at 08/18/15 1332  Gross per 24 hour  Intake 1883.33 ml    Output   1501 ml  Net 382.33 ml   Filed Weights   08/18/15 0610  Weight: 106.731 kg (235 lb 4.8 oz)    Exam:   General:  Alert and oriented 3   Cardiovascular: Regular rate and rhythm, S1-S2   Respiratory: Clear to auscultation bilaterally   Abdomen: Soft, mild distention, some minimal tenderness in the lower quadrants, hypoactive bowel sounds   Musculoskeletal: No clubbing or cyanosis or edema   Skin: Tattoos on right upper extremity, otherwise no skin breaks, tears or lesions  Psychiatry: Patient is appropriate, no evidence of psychoses    Data Reviewed: CBC:  Recent Labs Lab 08/17/15 1720 08/18/15 0437  WBC 14.6* 11.7*  HGB 16.7 13.9  HCT 45.2 39.7  MCV 90.8 91.9  PLT 164 [id number]*   Basic Metabolic Panel:  Recent Labs Lab 08/17/15 1752 08/18/15 0437  NA 132* 133*  K 4.6 4.0  CL 102 102  CO2 20* 22  GLUCOSE 168* 257*  BUN 12 11  CREATININE 0.77 0.87  CALCIUM 8.4* 8.5*   GFR: Estimated Creatinine Clearance: 131 mL/min (by C-G formula based on Cr of 0.87). Liver Function Tests:  Recent Labs Lab 08/17/15 1752 08/18/15 0019 08/18/15 0437  AST 46* 18 17  ALT 50 30 32  ALKPHOS 69 63 65  BILITOT 2.8* 1.4* 1.1  PROT 6.0* 5.8* 6.1*  ALBUMIN 3.3* 3.1* 3.0*    Recent Labs Lab 08/17/15 1752 08/18/15 0437  LIPASE 36 32   No results for input(s): AMMONIA in the last 168  hours. Coagulation Profile: No results for input(s): INR, PROTIME in the last 168 hours. Cardiac Enzymes:  Recent Labs Lab 08/17/15 2145  TROPONINI <0.03   BNP (last 3 results) No results for input(s): PROBNP in the last 8760 hours. HbA1C: No results for input(s): HGBA1C in the last 72 hours. CBG: No results for input(s): GLUCAP in the last 168 hours. Lipid Profile: No results for input(s): CHOL, HDL, LDLCALC, TRIG, CHOLHDL, LDLDIRECT in the last 72 hours. Thyroid Function Tests: No results for input(s): TSH, T4TOTAL, FREET4, T3FREE, THYROIDAB in the last 72  hours. Anemia Panel: No results for input(s): VITAMINB12, FOLATE, FERRITIN, TIBC, IRON, RETICCTPCT in the last 72 hours. Urine analysis:    Component Value Date/Time   COLORURINE AMBER* 08/17/2015 1821   APPEARANCEUR CLOUDY* 08/17/2015 1821   LABSPEC 1.018 08/17/2015 1821   PHURINE 6.0 08/17/2015 1821   GLUCOSEU 500* 08/17/2015 1821   HGBUR TRACE* 08/17/2015 1821   BILIRUBINUR SMALL* 08/17/2015 1821   KETONESUR >80* 08/17/2015 1821   PROTEINUR NEGATIVE 08/17/2015 1821   NITRITE NEGATIVE 08/17/2015 1821   LEUKOCYTESUR NEGATIVE 08/17/2015 1821   Sepsis Labs: @LABRCNTIP (procalcitonin:4,lacticidven:4)  ) Recent Results (from the past 240 hour(s))  Culture, blood (routine x 2)     Status: None (Preliminary result)   Collection Time: 08/18/15  6:55 AM  Result Value Ref Range Status   Specimen Description BLOOD LEFT ARM  Final   Special Requests   Final    BOTTLES DRAWN AEROBIC AND ANAEROBIC 10CC Performed at [facility]    Culture PENDING  Incomplete   Report Status PENDING  Incomplete      Studies: Ct Abdomen Pelvis W Contrast  08/17/2015  CLINICAL DATA:  50 year old male with right lower quadrant abdominal pain. Concern for appendicitis. EXAM: CT ABDOMEN AND PELVIS WITH CONTRAST TECHNIQUE: Multidetector CT imaging of the abdomen and pelvis was performed using the standard protocol following bolus administration of intravenous contrast. CONTRAST:  124mL ISOVUE-300 IOPAMIDOL (ISOVUE-300) INJECTION 61% COMPARISON:  Lumbar spine MRI dated 02/13/2015 FINDINGS: The visualized lung bases are clear. Is no intra-abdominal free air. Trace free fluid may be present within pelvis. There is diffuse hepatic steatosis with foci of fatty sparring the adjacent the gallbladder. Set the gallbladder appears unremarkable. There is inflammatory fluid centers in the epigastric area surrounding the pancreas and duodenal C-loop extending into the lower abdomen and pelvis. Sign this may be related to  acute pancreatitis or duodenitis. Correlation with clinical exam and pancreatic enzymes recommended. There is no drainable fluid collection/abscess or pseudocyst. No pancreatic duct dilatation stop or gland atrophy. The spleen, adrenal glands appear unremarkable. There is a punctate nonobstructing left renal inferior pole calculus. There is a 4 mm nonobstructing right renal inferior pole calculus. No hydronephrosis on either side. The visualized ureters and urinary bladder appear unremarkable. The prostate, and seminal vesicles are grossly unremarkable. Moderate stool noted throughout the colon. There is no evidence of bowel obstruction. Normal appendix. The abdominal aorta and IVC appear unremarkable. No portal venous gas identified. There is no adenopathy. The origins of the celiac axis, SMA, IMA as well as the origins of the renal arteries are patent. There is a small fat containing umbilical hernia. The abdominal wall soft tissues appear unremarkable. Fifty there is mild degenerative changes of the spine. No acute fracture. IMPRESSION: Inflammatory changes of the upper abdomen involving the pancreas and duodenal C-loop with extension of the inflammatory fluid into the lower abdomen. Findings represent acute pancreatitis or duodenitis. Correlation with clinical exam  and pancreatic enzymes recommended. No evidence of bowel obstruction.  Normal appendix. A 4 mm nonobstructing right renal inferior pole calculus. Fatty liver. Electronically Signed   By: Anner Crete M.D.   On: 08/17/2015 19:13   Dg Chest Port 1 View  08/18/2015  CLINICAL DATA:  Fever this morning. EXAM: PORTABLE CHEST 1 VIEW COMPARISON:  None. FINDINGS: A single AP portable view of the chest demonstrates no focal airspace consolidation or alveolar edema. The lungs are grossly clear. There is no large effusion or pneumothorax. Cardiac and mediastinal contours appear unremarkable. IMPRESSION: No active disease. Electronically Signed   By: Andreas Newport M.D.   On: 08/18/2015 06:12    Scheduled Meds: . ciprofloxacin  400 mg Intravenous Q12H  . enoxaparin (LOVENOX) injection  40 mg Subcutaneous Q24H  . metronidazole  500 mg Intravenous Q8H  . pantoprazole (PROTONIX) IV  40 mg Intravenous Q12H    Continuous Infusions: . sodium chloride 100 mL/hr at 08/17/15 2330     LOS: 1 day   Time spent: 25 minutes  Annita Brod, MD [facility] Pager [phone]  If 7PM-7AM, please contact night-coverage [email or web address] Password [email or web address] 08/18/2015, 1:33 PM

## 2015-08-19 DIAGNOSIS — R509 Fever, unspecified: Secondary | ICD-10-CM | POA: Insufficient documentation

## 2015-08-19 DIAGNOSIS — R109 Unspecified abdominal pain: Secondary | ICD-10-CM | POA: Insufficient documentation

## 2015-08-19 DIAGNOSIS — E1310 Other specified diabetes mellitus with ketoacidosis without coma: Secondary | ICD-10-CM

## 2015-08-19 LAB — CBC
HCT: 37.7 % — ABNORMAL LOW (ref 39.0–52.0)
Hemoglobin: 13.2 g/dL (ref 13.0–17.0)
MCH: 32.8 pg (ref 26.0–34.0)
MCHC: 35 g/dL (ref 30.0–36.0)
MCV: 93.5 fL (ref 78.0–100.0)
PLATELETS: 146 10*3/uL — AB (ref 150–400)
RBC: 4.03 MIL/uL — ABNORMAL LOW (ref 4.22–5.81)
RDW: 12.4 % (ref 11.5–15.5)
WBC: 10.5 10*3/uL (ref 4.0–10.5)

## 2015-08-19 LAB — COMPREHENSIVE METABOLIC PANEL
ALT: 23 U/L (ref 17–63)
ANION GAP: 9 (ref 5–15)
AST: 14 U/L — ABNORMAL LOW (ref 15–41)
Albumin: 2.9 g/dL — ABNORMAL LOW (ref 3.5–5.0)
Alkaline Phosphatase: 54 U/L (ref 38–126)
BUN: 10 mg/dL (ref 6–20)
CALCIUM: 8.4 mg/dL — AB (ref 8.9–10.3)
CO2: 22 mmol/L (ref 22–32)
Chloride: 105 mmol/L (ref 101–111)
Creatinine, Ser: 0.81 mg/dL (ref 0.61–1.24)
GFR calc non Af Amer: >60 mL/min (ref >60)
GLUCOSE: 192 mg/dL — AB (ref 65–99)
POTASSIUM: 3.8 mmol/L (ref 3.5–5.1)
SODIUM: 136 mmol/L (ref 135–145)
TOTAL PROTEIN: 5.8 g/dL — AB (ref 6.5–8.1)
Total Bilirubin: 1.2 mg/dL (ref 0.3–1.2)

## 2015-08-19 LAB — HEMOGLOBIN A1C
HEMOGLOBIN A1C: 9.8 % — AB (ref 4.8–5.6)
Mean Plasma Glucose: 235 mg/dL

## 2015-08-19 LAB — GLUCOSE, CAPILLARY
GLUCOSE-CAPILLARY: 167 mg/dL — AB (ref 65–99)
Glucose-Capillary: 196 mg/dL — ABNORMAL HIGH (ref 65–99)
Glucose-Capillary: 207 mg/dL — ABNORMAL HIGH (ref 65–99)

## 2015-08-19 LAB — D-DIMER, QUANTITATIVE (NOT AT ARMC): D DIMER QUANT: 2.65 ug{FEU}/mL — AB (ref 0.00–0.50)

## 2015-08-19 MED ORDER — LIVING WELL WITH DIABETES BOOK
Freq: Once | Status: AC
  Administered 2015-08-19: 12:00:00
  Filled 2015-08-19: qty 1

## 2015-08-19 MED ORDER — CYCLOBENZAPRINE HCL 10 MG PO TABS
10.0000 mg | ORAL_TABLET | Freq: Three times a day (TID) | ORAL | Status: DC | PRN
  Administered 2015-08-19 – 2015-08-20 (×2): 10 mg via ORAL
  Filled 2015-08-19 (×2): qty 1

## 2015-08-19 MED ORDER — HYDROCODONE-ACETAMINOPHEN 5-325 MG PO TABS
1.0000 | ORAL_TABLET | ORAL | Status: DC | PRN
  Administered 2015-08-21: 1 via ORAL
  Filled 2015-08-19 (×2): qty 1

## 2015-08-19 MED ORDER — SILDENAFIL CITRATE 20 MG PO TABS
20.0000 mg | ORAL_TABLET | Freq: Three times a day (TID) | ORAL | Status: DC
  Administered 2015-08-19 – 2015-08-20 (×4): 20 mg via ORAL
  Filled 2015-08-19 (×9): qty 1

## 2015-08-19 MED ORDER — INSULIN ASPART 100 UNIT/ML ~~LOC~~ SOLN
0.0000 [IU] | Freq: Three times a day (TID) | SUBCUTANEOUS | Status: DC
  Administered 2015-08-19 – 2015-08-22 (×8): 2 [IU] via SUBCUTANEOUS

## 2015-08-19 MED ORDER — INSULIN ASPART 100 UNIT/ML ~~LOC~~ SOLN
0.0000 [IU] | Freq: Every day | SUBCUTANEOUS | Status: DC
  Administered 2015-08-19: 2 [IU] via SUBCUTANEOUS

## 2015-08-19 NOTE — Progress Notes (Signed)
Inpatient Diabetes Program Recommendations  AACE/ADA: New Consensus Statement on Inpatient Glycemic Control (2015)  Target Ranges:  Prepandial:   less than 140 mg/dL      Peak postprandial:   less than 180 mg/dL (1-2 hours)      Critically ill patients:  140 - 180 mg/dL   Review of Glycemic Control  Diabetes history: None Outpatient Diabetes medications: None Current orders for Inpatient glycemic control: Novolog sensitive tidwc and hs  Results for TELFORD, GIRDNER (MRN [id number]) as of 08/19/2015 12:05  Ref. Range 08/18/2015 00:19  Hemoglobin A1C Latest Ref Range: 4.8-5.6 % 9.8 (H)  Results for ROMEN, SPINNER (MRN [id number]) as of 08/19/2015 12:05  Ref. Range 08/17/2015 17:52 08/18/2015 04:37 08/19/2015 04:28  Glucose Latest Ref Range: 65-99 mg/dL 168 (H) 257 (H) 192 (H)    Inpatient Diabetes Program Recommendations:    Metformin 500 bid  Spoke with patient about new diabetes diagnosis.  Discussed A1C results (9.8%) and explained what an A1C is and informed patient that his current A1C indicates an average glucose over the past 2-3 months. Discussed basic pathophysiology of DM Type 2, basic home care, importance of checking CBGs and maintaining good CBG control to prevent long-term and short-term complications. Reviewed glucose and A1C goals and explained that patient will need to continue to   Discussed impact of nutrition, exercise, stress, sickness, and medications on diabetes control. Reviewed Living Well with diabetes booklet and encouraged patient to read through entire book. Explained how the doctor he follows up with can use the log book to continue to make insulin adjustments if needed.   Patient verbalized understanding of information discussed and he states that he has no further questions at this time related to diabetes.   RNs to provide ongoing basic DM education at bedside with this patient and engage patient to actively check blood glucose and administer insulin injections.   Pt seems to  have flat affect. Stressed importance of obtaining a PCP to manage his DM2. Has order for OP Diabetes Education. Will need prescription for meter and supplies at discharge. Order # [id number] Instructed to check blood sugars at various times of the day.  Will follow. Thank you. Lorenda Peck, RD, LDN, CDE Inpatient Diabetes Coordinator [phone]

## 2015-08-19 NOTE — Progress Notes (Signed)
Diabetes Coordinator in to see pt earlier in shift. Pt refused to watch diabetic videos when offered this afternoon. Pt found reading through "Living Well with Diabetes" booklet around 1700. Encouraged pt to ask questions as needed. Reassurance given.

## 2015-08-19 NOTE — Consult Note (Signed)
Referring Provider: No ref. provider found Primary Care Physician:  No primary care provider on file. Primary Gastroenterologist:  Althia Forts  Reason for Consultation:  Abdominal pain and fever  HPI: Steve Ritter is a 50 y.o. male with medical history significant of hypertension, sleep apnea who presented to the ER on 5/7 with complaints of abdominal pain.  Says that the pain began suddenly in his RLQ while at work on 5/5.  No other associated symptoms initially but then started developing fevers.  No nausea, vomiting, or diarrhea.  Drinking some clear liquids, which he says does not make the pain worse, but he just does not have an appetite.  Has history of episode of "ischemic colitis" and underwent colonoscopy in [place] 5-6 years ago for that.  No issues since that time.  During that episode he presented with abdominal pain and bloody diarrhea.  Patient drinks alcohol very occasionally.  In the ED CT scan of the abdomen and pelvis showed the following:  IMPRESSION: Inflammatory changes of the upper abdomen involving the pancreas and duodenal C-loop with extension of the inflammatory fluid into the lower abdomen. Findings represent acute pancreatitis or duodenitis. Correlation with clinical exam and pancreatic enzymes recommended.  No evidence of bowel obstruction. Normal appendix.  A 4 mm nonobstructing right renal inferior pole calculus.  Fatty liver.  Lipase was normal.  TB initially 2.8 but has normalized and other LFT's normal.  Had a leukocytosis of 14.6 as well so was started on Zosyn and then was eventually switched to cipro and flagyl.  GI consulted due to persistent fevers and pain despite conservative treatment.  CXR, urine studies negative.  Blood cultures are pending.  Tmax 102.3 with temp of 102.2 this afternoon.  Just of note, he was also given a new diagnosis of DM during this hospital stay as well as confirmed by his Hgb A1C.  D-Dimer elevated today at 2.65.  ?  Further evaluation needed.   Past Medical History  Diagnosis Date  . Hypertension     Past Surgical History  Procedure Laterality Date  . Bilateral carpal tunnel release Bilateral     Prior to Admission medications   Medication Sig Start Date End Date Taking? Authorizing Provider  ALPRAZolam Duanne Moron) 0.5 MG tablet Take 1 tablet by mouth as needed. anxiety 07/28/15  Yes Historical Provider, MD  amLODipine (NORVASC) 5 MG tablet Take 5 mg by mouth daily.   Yes Historical Provider, MD  aspirin EC 81 MG tablet Take 81 mg by mouth.   Yes Historical Provider, MD  omeprazole (PRILOSEC) 10 MG capsule Take 10 mg by mouth daily.   Yes Historical Provider, MD  oxyCODONE-acetaminophen (ROXICET) 5-325 MG tablet Take 1 tablet by mouth every 8 (eight) hours as needed for severe pain. 01/25/15  Yes Robyn Haber, MD  polyethylene glycol [facility] / GLYCOLAX) packet Take 1 packet by mouth daily.   Yes Historical Provider, MD  sildenafil (REVATIO) 20 MG tablet Take 20 mg by mouth 3 (three) times daily.   Yes Historical Provider, MD  varenicline (CHANTIX CONTINUING MONTH PAK) 1 MG tablet Take 1 tablet by mouth daily. 09/08/12  Yes Historical Provider, MD  vitamin E 100 UNIT capsule Take by mouth.   Yes Historical Provider, MD  predniSONE (DELTASONE) 20 MG tablet Take 3 tablets (60 mg total) by mouth daily with breakfast. For 3 days, then 2 daily for three days, then 1 daily 01/25/15   Robyn Haber, MD    Current Facility-Administered Medications  Medication  Dose Route Frequency Provider Last Rate Last Dose  . acetaminophen (TYLENOL) tablet 650 mg  650 mg Oral Q6H PRN Rise Patience, MD   650 mg at 08/19/15 1450   Or  . acetaminophen (TYLENOL) suppository 650 mg  650 mg Rectal Q6H PRN Rise Patience, MD      . ciprofloxacin (CIPRO) IVPB 400 mg  400 mg Intravenous Q12H Annita Brod, MD   400 mg at 08/19/15 0520  . enoxaparin (LOVENOX) injection 40 mg  40 mg Subcutaneous Q24H Rise Patience, MD   40 mg at 08/19/15 1125  . hydrALAZINE (APRESOLINE) injection 10 mg  10 mg Intravenous Q4H PRN Rise Patience, MD      . insulin aspart (novoLOG) injection 0-5 Units  0-5 Units Subcutaneous QHS Annita Brod, MD      . insulin aspart (novoLOG) injection 0-9 Units  0-9 Units Subcutaneous TID WC Annita Brod, MD   2 Units at 08/19/15 1257  . metroNIDAZOLE (FLAGYL) IVPB 500 mg  500 mg Intravenous Q8H Annita Brod, MD   500 mg at 08/19/15 [id number]  . morphine 2 MG/ML injection 2-4 mg  2-4 mg Intravenous Q4H PRN Dianne Dun, NP   2 mg at 08/19/15 1302  . ondansetron (ZOFRAN) tablet 4 mg  4 mg Oral Q6H PRN Rise Patience, MD       Or  . ondansetron [facility]) injection 4 mg  4 mg Intravenous Q6H PRN Rise Patience, MD   4 mg at 08/18/15 2350  . pantoprazole (PROTONIX) injection 40 mg  40 mg Intravenous Q12H Rise Patience, MD   40 mg at 08/19/15 1125  . sildenafil (REVATIO) tablet 20 mg  20 mg Oral TID Annita Brod, MD   20 mg at 08/19/15 1124    Allergies as of 08/17/2015 - Review Complete 08/17/2015  Allergen Reaction Noted  . Codeine  11/30/2006  . Erythromycin  11/30/2006  . Penicillins  11/30/2006    Family History  Problem Relation Age of Onset  . Hypertension Mother   . Diabetes Father   . Heart disease Father   . Hypertension Father   . Stroke Father   . Mental retardation Father     Social History   Social History  . Marital Status: Single    Spouse Name: N/A  . Number of Children: N/A  . Years of Education: N/A   Occupational History  . Not on file.   Social History Main Topics  . Smoking status: Former Research scientist (life sciences)  . Smokeless tobacco: Not on file  . Alcohol Use: Not on file  . Drug Use: Not on file  . Sexual Activity: Not on file   Other Topics Concern  . Not on file   Social History Narrative    Review of Systems: Ten point ROS is O/W negative except as mentioned in HPI.  Physical Exam: Vital  signs in last 24 hours: Temp:  [100.3 F (37.9 C)-102.2 F (39 C)] 102.2 F (39 C) (05/09 1359) Pulse Rate:  [87-91] 91 (05/09 1359) Resp:  [18-20] 18 (05/09 1359) BP: (115-128)/(66-76) 128/76 mmHg (05/09 1359) SpO2:  [91 %-96 %] 91 % (05/09 1359) Last BM Date: 08/15/15 General:  Alert, Well-developed, well-nourished, pleasant and cooperative in NAD Head:  Normocephalic and atraumatic. Eyes:  Sclera clear, no icterus.  Conjunctiva pink. Ears:  Normal auditory acuity. Mouth:  No deformity or lesions.   Lungs:  Clear throughout to auscultation.  No  wheezes, crackles, or rhonchi.  Heart:  Regular rate and rhythm; no murmurs, clicks, rubs,  or gallops. Abdomen:  Soft, non-distended.  BS present.  RLQ TTP. Rectal:  Deferred  Msk:  Symmetrical without gross deformities. Pulses:  Normal pulses noted. Extremities:  Without clubbing or edema. Neurologic:  Alert and oriented x 4;  grossly normal neurologically. Skin:  Intact without significant lesions or rashes. Psych:  Alert and cooperative. Normal mood and affect.  Intake/Output from previous day: 05/08 0701 - 05/09 0700 In: 1353.3 [P.O.:600; I.V.:753.3] Out: 300 [Urine:300]  Lab Results:  Recent Labs  08/17/15 1720 08/18/15 0437 08/19/15 0428  WBC 14.6* 11.7* 10.5  HGB 16.7 13.9 13.2  HCT 45.2 39.7 37.7*  PLT 164 143* 146*   BMET  Recent Labs  08/17/15 1752 08/18/15 0437 08/19/15 0428  NA 132* 133* 136  K 4.6 4.0 3.8  CL 102 102 105  CO2 20* 22 22  GLUCOSE 168* 257* 192*  BUN 12 11 10   CREATININE 0.77 0.87 0.81  CALCIUM 8.4* 8.5* 8.4*   LFT  Recent Labs  08/18/15 0437 08/19/15 0428  PROT 6.1* 5.8*  ALBUMIN 3.0* 2.9*  AST 17 14*  ALT 32 23  ALKPHOS 65 54  BILITOT 1.1 1.2  BILIDIR 0.2  --   IBILI 0.9  --    Studies/Results: Ct Abdomen Pelvis W Contrast  08/17/2015  CLINICAL DATA:  50 year old male with right lower quadrant abdominal pain. Concern for appendicitis. EXAM: CT ABDOMEN AND PELVIS WITH  CONTRAST TECHNIQUE: Multidetector CT imaging of the abdomen and pelvis was performed using the standard protocol following bolus administration of intravenous contrast. CONTRAST:  145mL ISOVUE-300 IOPAMIDOL (ISOVUE-300) INJECTION 61% COMPARISON:  Lumbar spine MRI dated 02/13/2015 FINDINGS: The visualized lung bases are clear. Is no intra-abdominal free air. Trace free fluid may be present within pelvis. There is diffuse hepatic steatosis with foci of fatty sparring the adjacent the gallbladder. Set the gallbladder appears unremarkable. There is inflammatory fluid centers in the epigastric area surrounding the pancreas and duodenal C-loop extending into the lower abdomen and pelvis. Sign this may be related to acute pancreatitis or duodenitis. Correlation with clinical exam and pancreatic enzymes recommended. There is no drainable fluid collection/abscess or pseudocyst. No pancreatic duct dilatation stop or gland atrophy. The spleen, adrenal glands appear unremarkable. There is a punctate nonobstructing left renal inferior pole calculus. There is a 4 mm nonobstructing right renal inferior pole calculus. No hydronephrosis on either side. The visualized ureters and urinary bladder appear unremarkable. The prostate, and seminal vesicles are grossly unremarkable. Moderate stool noted throughout the colon. There is no evidence of bowel obstruction. Normal appendix. The abdominal aorta and IVC appear unremarkable. No portal venous gas identified. There is no adenopathy. The origins of the celiac axis, SMA, IMA as well as the origins of the renal arteries are patent. There is a small fat containing umbilical hernia. The abdominal wall soft tissues appear unremarkable. [age] there is mild degenerative changes of the spine. No acute fracture. IMPRESSION: Inflammatory changes of the upper abdomen involving the pancreas and duodenal C-loop with extension of the inflammatory fluid into the lower abdomen. Findings represent acute  pancreatitis or duodenitis. Correlation with clinical exam and pancreatic enzymes recommended. No evidence of bowel obstruction.  Normal appendix. A 4 mm nonobstructing right renal inferior pole calculus. Fatty liver. Electronically Signed   By: Anner Crete M.D.   On: 08/17/2015 19:13   Dg Chest Port 1 View  08/18/2015  CLINICAL DATA:  Fever this morning. EXAM: PORTABLE CHEST 1 VIEW COMPARISON:  None. FINDINGS: A single AP portable view of the chest demonstrates no focal airspace consolidation or alveolar edema. The lungs are grossly clear. There is no large effusion or pneumothorax. Cardiac and mediastinal contours appear unremarkable. IMPRESSION: No active disease. Electronically Signed   By: Andreas Newport M.D.   On: 08/18/2015 06:12   IMPRESSION:  -50 year old male who presented RLQ abdominal pain:  CT scan suggested pancreatitis vs duodenitis.  Lipase normal.  Treated with supportive care and started on antibiotics due to fevers/leukocytosis.  Pain and fever persistent.  Unsure of the source of fever.  Other evaluation negative and blood cultures pending.  Pancreatitis could certainly cause fever, but duodenitis should not.  Total bili initially elevated at 2.8 and AST 46, but those quickly normalized.  ?Biliary source? -Newly diagnosed DM this admission  PLAN: -Plan for EGD tomorrow to evaluated possible duodenitis. -Otherwise await blood cultures and continue supportive care for now.  Elese Rane D.  08/19/2015, 3:59 PM  Pager number [phone]

## 2015-08-19 NOTE — Progress Notes (Signed)
PROGRESS NOTE  RODNER KUMMER [id number] DOB: Feb 05, 1966 DOA: 08/17/2015 PCP: No primary care provider on file.  HPI/Recap of past 10 hours: 50 year old male past history of sleep apnea and essential hypertension admitted on 5/7 with 2 days of lower quadrant abdominal pain. No nausea or vomiting or diarrhea. CT scan noted inflammatory changes in the upper abdomen concerning for duodenitis versus pancreatitis. Lipase level normal. Patient also noted to have mild leukocytosis with white count of 14. Following admission, spiked temperature of 102. No evidence of sepsis. Started on IV antibiotics and fluids.  Over the next few days, white count normalized. Diet advanced.  Patient still continues to have pain, and again today, spiking fevers despite abx.    Assessment/Plan: Principal Problem:   Duodenitis causing abdominal pain: Unclear etiology, likely infectious given fevers. Continue antibiotics. Elbow his white count is improved, he does not appear to be clinically better and with fevers, have consulted GI to see. Active Problems:   Essential hypertension: Blood pressure stable Sleep apnea: Stable Constipation: Noted on CT scan, as pain subsides, add bowel regimen  Obesity: Patient meets criteria with BMI greater than 30  Diabetes Mellitus type 2, uncontrolled: new dx.  Confirmed by A1c.  Diabetes educator as started education. Upon discharge, we'll start metformin ous history of diabetes..   Code Status: Full code   Family Communication: Left message for family   Disposition Plan: Pending GI evaluation   Consultants:  None   Procedures:   None  Antimicrobials:  IV Cipro and Flagyl: 5/18-present  IV cefepime and vancomycin 5/7-5/8  DVT prophylaxis:  Lovenox   Objective: Filed Vitals:   08/18/15 1832 08/18/15 2150 08/19/15 0500 08/19/15 1359  BP:  123/69 115/66 128/76  Pulse:  87 90 91  Temp: 100.9 F (38.3 C) 100.3 F (37.9 C) 100.4 F (38 C) 102.2 F (39 C)    TempSrc: Oral Oral Oral Oral  Resp:  18 18 18   Height:      Weight:      SpO2:  96% 93% 91%    Intake/Output Summary (Last 24 hours) at 08/19/15 1528 Last data filed at 08/18/15 1819  Gross per 24 hour  Intake    120 ml  Output    300 ml  Net   -180 ml   Filed Weights   08/18/15 0610  Weight: 106.731 kg (235 lb 4.8 oz)    Exam: Unchanged from previous day  General:  Alert and oriented 3 , mild distress from pain  Cardiovascular: Regular rate and rhythm, S1-S2   Respiratory: Clear to auscultation bilaterally   Abdomen: Soft, mild distention, some minimal tenderness in the lower quadrants, hypoactive bowel sounds   Musculoskeletal: No clubbing or cyanosis or edema   Skin: Tattoos on right upper extremity, otherwise no skin breaks, tears or lesions  Psychiatry: Patient is appropriate, no evidence of psychoses    Data Reviewed: CBC:  Recent Labs Lab 08/17/15 1720 08/18/15 0437 08/19/15 0428  WBC 14.6* 11.7* 10.5  HGB 16.7 13.9 13.2  HCT 45.2 39.7 37.7*  MCV 90.8 91.9 93.5  PLT 164 143* [id number]*   Basic Metabolic Panel:  Recent Labs Lab 08/17/15 1752 08/18/15 0437 08/19/15 0428  NA 132* 133* 136  K 4.6 4.0 3.8  CL 102 102 105  CO2 20* 22 22  GLUCOSE 168* 257* 192*  BUN 12 11 10   CREATININE 0.77 0.87 0.81  CALCIUM 8.4* 8.5* 8.4*   GFR: Estimated Creatinine Clearance: 140.7 mL/min (by C-G formula  based on Cr of 0.81). Liver Function Tests:  Recent Labs Lab 08/17/15 1752 08/18/15 0019 08/18/15 0437 08/19/15 0428  AST 46* 18 17 14*  ALT 50 30 32 23  ALKPHOS 69 63 65 54  BILITOT 2.8* 1.4* 1.1 1.2  PROT 6.0* 5.8* 6.1* 5.8*  ALBUMIN 3.3* 3.1* 3.0* 2.9*    Recent Labs Lab 08/17/15 1752 08/18/15 0437  LIPASE 36 32   No results for input(s): AMMONIA in the last 168 hours. Coagulation Profile: No results for input(s): INR, PROTIME in the last 168 hours. Cardiac Enzymes:  Recent Labs Lab 08/17/15 2145  TROPONINI <0.03   BNP (last 3  results) No results for input(s): PROBNP in the last 8760 hours. HbA1C:  Recent Labs  08/18/15 0019  HGBA1C 9.8*   CBG:  Recent Labs Lab 08/19/15 1203  GLUCAP 196*   Lipid Profile: No results for input(s): CHOL, HDL, LDLCALC, TRIG, CHOLHDL, LDLDIRECT in the last 72 hours. Thyroid Function Tests: No results for input(s): TSH, T4TOTAL, FREET4, T3FREE, THYROIDAB in the last 72 hours. Anemia Panel: No results for input(s): VITAMINB12, FOLATE, FERRITIN, TIBC, IRON, RETICCTPCT in the last 72 hours. Urine analysis:    Component Value Date/Time   COLORURINE AMBER* 08/17/2015 1821   APPEARANCEUR CLOUDY* 08/17/2015 1821   LABSPEC 1.018 08/17/2015 1821   PHURINE 6.0 08/17/2015 1821   GLUCOSEU 500* 08/17/2015 1821   HGBUR TRACE* 08/17/2015 1821   BILIRUBINUR SMALL* 08/17/2015 1821   KETONESUR >80* 08/17/2015 1821   PROTEINUR NEGATIVE 08/17/2015 1821   NITRITE NEGATIVE 08/17/2015 1821   LEUKOCYTESUR NEGATIVE 08/17/2015 1821   Sepsis Labs: @LABRCNTIP (procalcitonin:4,lacticidven:4)  ) Recent Results (from the past 240 hour(s))  Culture, blood (routine x 2)     Status: None (Preliminary result)   Collection Time: 08/18/15  6:55 AM  Result Value Ref Range Status   Specimen Description BLOOD LEFT ARM  Final   Special Requests   Final    BOTTLES DRAWN AEROBIC AND ANAEROBIC 10CC Performed at [facility]    Culture PENDING  Incomplete   Report Status PENDING  Incomplete      Studies: No results found.  Scheduled Meds: . ciprofloxacin  400 mg Intravenous Q12H  . enoxaparin (LOVENOX) injection  40 mg Subcutaneous Q24H  . insulin aspart  0-5 Units Subcutaneous QHS  . insulin aspart  0-9 Units Subcutaneous TID WC  . metronidazole  500 mg Intravenous Q8H  . pantoprazole (PROTONIX) IV  40 mg Intravenous Q12H  . sildenafil  20 mg Oral TID    Continuous Infusions:     LOS: 2 days   Time spent: 25 minutes  Annita Brod, MD [facility] Pager  [phone]  If 7PM-7AM, please contact night-coverage [email or web address] Password [email or web address] 08/19/2015, 3:28 PM

## 2015-08-20 ENCOUNTER — Inpatient Hospital Stay (HOSPITAL_COMMUNITY): Payer: 59 | Admitting: Anesthesiology

## 2015-08-20 ENCOUNTER — Inpatient Hospital Stay (HOSPITAL_COMMUNITY): Payer: 59

## 2015-08-20 ENCOUNTER — Encounter (HOSPITAL_COMMUNITY): Payer: Self-pay | Admitting: Internal Medicine

## 2015-08-20 ENCOUNTER — Encounter (HOSPITAL_COMMUNITY)
Admission: EM | Disposition: A | Discharge status: Home / Self Care | Payer: Self-pay | Source: Home / Self Care | Attending: Internal Medicine

## 2015-08-20 DIAGNOSIS — R933 Abnormal findings on diagnostic imaging of other parts of digestive tract: Secondary | ICD-10-CM

## 2015-08-20 DIAGNOSIS — R791 Abnormal coagulation profile: Secondary | ICD-10-CM

## 2015-08-20 HISTORY — PX: ESOPHAGOGASTRODUODENOSCOPY: SHX5428

## 2015-08-20 LAB — GLUCOSE, CAPILLARY
Glucose-Capillary: 193 mg/dL — ABNORMAL HIGH (ref 65–99)
Glucose-Capillary: 176 mg/dL — ABNORMAL HIGH (ref 65–99)
Glucose-Capillary: 155 mg/dL — ABNORMAL HIGH (ref 65–99)
Glucose-Capillary: 135 mg/dL — ABNORMAL HIGH (ref 65–99)

## 2015-08-20 LAB — COMPREHENSIVE METABOLIC PANEL
ALBUMIN: 2.9 g/dL — AB (ref 3.5–5.0)
ALT: 23 U/L (ref 17–63)
AST: 17 U/L (ref 15–41)
Alkaline Phosphatase: 56 U/L (ref 38–126)
Anion gap: 11 (ref 5–15)
BUN: 14 mg/dL (ref 6–20)
CHLORIDE: 102 mmol/L (ref 101–111)
CO2: 23 mmol/L (ref 22–32)
Calcium: 8.6 mg/dL — ABNORMAL LOW (ref 8.9–10.3)
Creatinine, Ser: 0.78 mg/dL (ref 0.61–1.24)
GFR calc Af Amer: >60 mL/min (ref >60)
GFR calc non Af Amer: >60 mL/min (ref >60)
Glucose, Bld: 193 mg/dL — ABNORMAL HIGH (ref 65–99)
POTASSIUM: 3.8 mmol/L (ref 3.5–5.1)
SODIUM: 136 mmol/L (ref 135–145)
TOTAL PROTEIN: 6.4 g/dL — AB (ref 6.5–8.1)
Total Bilirubin: 1.6 mg/dL — ABNORMAL HIGH (ref 0.3–1.2)

## 2015-08-20 LAB — CBC
HEMATOCRIT: 38.1 % — AB (ref 39.0–52.0)
Hemoglobin: 13.2 g/dL (ref 13.0–17.0)
MCH: 32.6 pg (ref 26.0–34.0)
MCHC: 34.6 g/dL (ref 30.0–36.0)
MCV: 94.1 fL (ref 78.0–100.0)
PLATELETS: 173 10*3/uL (ref 150–400)
RBC: 4.05 MIL/uL — AB (ref 4.22–5.81)
RDW: 12.5 % (ref 11.5–15.5)
WBC: 10.8 10*3/uL — AB (ref 4.0–10.5)

## 2015-08-20 SURGERY — EGD (ESOPHAGOGASTRODUODENOSCOPY)
Anesthesia: Monitor Anesthesia Care

## 2015-08-20 MED ORDER — LACTATED RINGERS IV SOLN
INTRAVENOUS | Status: DC | PRN
  Administered 2015-08-20: 11:00:00 via INTRAVENOUS

## 2015-08-20 MED ORDER — GADOBENATE DIMEGLUMINE 529 MG/ML IV SOLN
20.0000 mL | Freq: Once | INTRAVENOUS | Status: AC | PRN
  Administered 2015-08-20: 20 mL via INTRAVENOUS

## 2015-08-20 MED ORDER — PROPOFOL 10 MG/ML IV BOLUS
INTRAVENOUS | Status: DC | PRN
  Administered 2015-08-20: 50 mg via INTRAVENOUS
  Administered 2015-08-20 (×2): 20 mg via INTRAVENOUS

## 2015-08-20 MED ORDER — PROPOFOL 10 MG/ML IV BOLUS
INTRAVENOUS | Status: AC
  Filled 2015-08-20: qty 40

## 2015-08-20 MED ORDER — PROPOFOL 500 MG/50ML IV EMUL
INTRAVENOUS | Status: DC | PRN
  Administered 2015-08-20: 100 ug/kg/min via INTRAVENOUS

## 2015-08-20 MED ORDER — LIDOCAINE HCL (CARDIAC) 20 MG/ML IV SOLN
INTRAVENOUS | Status: DC | PRN
  Administered 2015-08-20: 50 mg via INTRAVENOUS

## 2015-08-20 MED ORDER — SODIUM CHLORIDE 0.9 % IV SOLN
INTRAVENOUS | Status: DC
  Administered 2015-08-20 – 2015-08-22 (×4): via INTRAVENOUS

## 2015-08-20 MED ORDER — LIDOCAINE HCL (CARDIAC) 20 MG/ML IV SOLN
INTRAVENOUS | Status: AC
  Filled 2015-08-20: qty 5

## 2015-08-20 MED ORDER — SODIUM CHLORIDE 0.9 % IV SOLN: INTRAVENOUS | Status: DC

## 2015-08-20 MED ORDER — PANTOPRAZOLE SODIUM 40 MG PO TBEC
40.0000 mg | DELAYED_RELEASE_TABLET | Freq: Every day | ORAL | Status: DC
  Administered 2015-08-20 – 2015-08-22 (×3): 40 mg via ORAL
  Filled 2015-08-20 (×7): qty 1

## 2015-08-20 NOTE — Anesthesia Preprocedure Evaluation (Signed)
Anesthesia Evaluation  Patient identified by MRN, date of birth, ID band Patient awake    Reviewed: Allergy & Precautions, NPO status , Patient's Chart, lab work & pertinent test results  Airway Mallampati: II  TM Distance: >3 FB Neck ROM: Full    Dental no notable dental hx.    Pulmonary neg pulmonary ROS, former smoker,    Pulmonary exam normal breath sounds clear to auscultation       Cardiovascular hypertension, Pt. on medications negative cardio ROS Normal cardiovascular exam Rhythm:Regular Rate:Normal     Neuro/Psych negative neurological ROS  negative psych ROS   GI/Hepatic negative GI ROS, Neg liver ROS,   Endo/Other  negative endocrine ROS  Renal/GU negative Renal ROS  negative genitourinary   Musculoskeletal negative musculoskeletal ROS (+)   Abdominal   Peds negative pediatric ROS (+)  Hematology negative hematology ROS (+)   Anesthesia Other Findings   Reproductive/Obstetrics negative OB ROS                             Anesthesia Physical Anesthesia Plan  ASA: II  Anesthesia Plan: MAC   Post-op Pain Management:    Induction: Intravenous  Airway Management Planned: Nasal Cannula  Additional Equipment:   Intra-op Plan:   Post-operative Plan: Extubation in OR  Informed Consent: I have reviewed the patients History and Physical, chart, labs and discussed the procedure including the risks, benefits and alternatives for the proposed anesthesia with the patient or authorized representative who has indicated his/her understanding and acceptance.   Dental advisory given  Plan Discussed with: CRNA  Anesthesia Plan Comments:         Anesthesia Quick Evaluation

## 2015-08-20 NOTE — Progress Notes (Addendum)
PROGRESS NOTE  Steve TORSIELLO [id number] DOB: Mar 23, 1966 DOA: 08/17/2015 PCP: No primary care provider on file.  HPI/Recap of past 72 hours: 50 year old male past history of sleep apnea and essential hypertension admitted on 5/7 with 2 days of lower quadrant abdominal pain. No nausea or vomiting or diarrhea. CT scan noted inflammatory changes in the upper abdomen concerning for duodenitis versus pancreatitis. Lipase level normal. Patient also noted to have mild leukocytosis with white count of 14. Following admission, spiked temperature of 102. No evidence of sepsis. Started on IV antibiotics and fluids.  Over the next few days, white count normalized. Diet advanced.  Patient still continues to have pain, and again , spiking fevers despite abx 5-09. GI consulted. Plan for endoscopy 5-10. Abnormal D dimer. Doppler ordered.   Assessment/Plan:  Duodenitis causing abdominal pain: Unclear etiology, likely infectious given fevers. Continue antibiotics.  WBC stable at 10, add IV fluids.  Plan for endoscopy today. further  evaluation depending on CT results.   Positive D dimer:  Check doppler. Might need to check CT angio, if doppler negative. Patient denies chest pan or dyspnea.   Screening for HIV ordered.   Essential hypertension: Blood pressure stable Sleep apnea: Stable  Constipation: Noted on CT scan. Had BM.   Obesity: Patient meets criteria with BMI greater than 30  Diabetes Mellitus type 2, uncontrolled: new dx.  Confirmed by A1c.  Diabetes educator as started education. Upon discharge, we'll start metformin ous history of diabetes..   Code Status: Full code   Family Communication; care discussed with patient.   Disposition Plan: Pending GI evaluation   Consultants:  GI  Procedures:   None  Antimicrobials:  IV Cipro and Flagyl: 5/18-present  IV cefepime and vancomycin 5/7-5/8  DVT prophylaxis:  Lovenox   Objective: Filed Vitals:   08/19/15 1359 08/19/15  1630 08/19/15 2139 08/20/15 0645  BP: 128/76  113/69 93/51  Pulse: 91  88 80  Temp: 102.2 F (39 C) 99.8 F (37.7 C) 100.4 F (38 C) 99.6 F (37.6 C)  TempSrc: Oral  Oral Oral  Resp: 18   18  Height:      Weight:      SpO2: 91%  94% 95%   No intake or output data in the 24 hours ending 08/20/15 0945 Filed Weights   08/18/15 0610  Weight: 106.731 kg (235 lb 4.8 oz)    Exam:   General:  Alert and oriented 3 no distress  Cardiovascular: Regular rate and rhythm, S1-S2   Respiratory: Clear to auscultation bilaterally   Abdomen: Soft, mild distention, tenderness right side abdomen.   Musculoskeletal: No clubbing or cyanosis or edema   Skin: Tattoos on right upper extremity, otherwise no skin breaks, tears or lesions  Psychiatry: Patient is appropriate, no evidence of psychoses    Data Reviewed: CBC:  Recent Labs Lab 08/17/15 1720 08/18/15 0437 08/19/15 0428 08/20/15 0433  WBC 14.6* 11.7* 10.5 10.8*  HGB 16.7 13.9 13.2 13.2  HCT 45.2 39.7 37.7* 38.1*  MCV 90.8 91.9 93.5 94.1  PLT 164 143* 146* [id number]   Basic Metabolic Panel:  Recent Labs Lab 08/17/15 1752 08/18/15 0437 08/19/15 0428 08/20/15 0433  NA 132* 133* 136 136  K 4.6 4.0 3.8 3.8  CL 102 102 105 102  CO2 20* 22 22 23   GLUCOSE 168* 257* 192* 193*  BUN 12 11 10 14   CREATININE 0.77 0.87 0.81 0.78  CALCIUM 8.4* 8.5* 8.4* 8.6*   GFR: Estimated Creatinine Clearance: 142.5  mL/min (by C-G formula based on Cr of 0.78). Liver Function Tests:  Recent Labs Lab 08/17/15 1752 08/18/15 0019 08/18/15 0437 08/19/15 0428 08/20/15 0433  AST 46* 18 17 14* 17  ALT 50 30 32 23 23  ALKPHOS 69 63 65 54 56  BILITOT 2.8* 1.4* 1.1 1.2 1.6*  PROT 6.0* 5.8* 6.1* 5.8* 6.4*  ALBUMIN 3.3* 3.1* 3.0* 2.9* 2.9*    Recent Labs Lab 08/17/15 1752 08/18/15 0437  LIPASE 36 32   No results for input(s): AMMONIA in the last 168 hours. Coagulation Profile: No results for input(s): INR, PROTIME in the last 168  hours. Cardiac Enzymes:  Recent Labs Lab 08/17/15 2145  TROPONINI <0.03   BNP (last 3 results) No results for input(s): PROBNP in the last 8760 hours. HbA1C:  Recent Labs  08/18/15 0019  HGBA1C 9.8*   CBG:  Recent Labs Lab 08/19/15 1203 08/19/15 1700 08/19/15 2136 08/20/15 0813  GLUCAP 196* 167* 207* 193*   Lipid Profile: No results for input(s): CHOL, HDL, LDLCALC, TRIG, CHOLHDL, LDLDIRECT in the last 72 hours. Thyroid Function Tests: No results for input(s): TSH, T4TOTAL, FREET4, T3FREE, THYROIDAB in the last 72 hours. Anemia Panel: No results for input(s): VITAMINB12, FOLATE, FERRITIN, TIBC, IRON, RETICCTPCT in the last 72 hours. Urine analysis:    Component Value Date/Time   COLORURINE AMBER* 08/17/2015 1821   APPEARANCEUR CLOUDY* 08/17/2015 1821   LABSPEC 1.018 08/17/2015 1821   PHURINE 6.0 08/17/2015 1821   GLUCOSEU 500* 08/17/2015 1821   HGBUR TRACE* 08/17/2015 1821   BILIRUBINUR SMALL* 08/17/2015 1821   KETONESUR >80* 08/17/2015 1821   PROTEINUR NEGATIVE 08/17/2015 1821   NITRITE NEGATIVE 08/17/2015 1821   LEUKOCYTESUR NEGATIVE 08/17/2015 1821   Sepsis Labs: @LABRCNTIP (procalcitonin:4,lacticidven:4)  ) Recent Results (from the past 240 hour(s))  Culture, blood (routine x 2)     Status: None (Preliminary result)   Collection Time: 08/18/15  6:55 AM  Result Value Ref Range Status   Specimen Description BLOOD LEFT ARM  Final   Special Requests BOTTLES DRAWN AEROBIC AND ANAEROBIC 10CC  Final   Culture   Final    NO GROWTH 1 DAY Performed at [facility]    Report Status PENDING  Incomplete  Culture, blood (routine x 2)     Status: None (Preliminary result)   Collection Time: 08/18/15  6:55 AM  Result Value Ref Range Status   Specimen Description BLOOD RIGHT HAND  Final   Special Requests BOTTLES DRAWN AEROBIC AND ANAEROBIC [place]  Final   Culture   Final    NO GROWTH 1 DAY Performed at [facility]    Report Status PENDING   Incomplete      Studies: No results found.  Scheduled Meds: . ciprofloxacin  400 mg Intravenous Q12H  . enoxaparin (LOVENOX) injection  40 mg Subcutaneous Q24H  . insulin aspart  0-5 Units Subcutaneous QHS  . insulin aspart  0-9 Units Subcutaneous TID WC  . metronidazole  500 mg Intravenous Q8H  . pantoprazole (PROTONIX) IV  40 mg Intravenous Q12H  . sildenafil  20 mg Oral TID    Continuous Infusions: . sodium chloride       LOS: 3 days   Time spent: 25 minutes  Elmarie Shiley, MD [facility] Pager [phone]  If 7PM-7AM, please contact night-coverage [email or web address] Password [email or web address] 08/20/2015, 9:45 AM

## 2015-08-20 NOTE — Transfer of Care (Signed)
Immediate Anesthesia Transfer of Care Note  Patient: Steve Ritter  Procedure(s) Performed: Procedure(s): ESOPHAGOGASTRODUODENOSCOPY (EGD) (N/A)  Patient Location: PACU  Anesthesia Type:MAC  Level of Consciousness: awake, alert  and oriented  Airway & Oxygen Therapy: Patient Spontanous Breathing and Patient connected to face mask oxygen  Post-op Assessment: Report given to RN and Post -op Vital signs reviewed and stable  Post vital signs: Reviewed and stable  Last Vitals:  Filed Vitals:   08/20/15 1023 08/20/15 1026  BP: 138/74   Pulse: 93   Temp: 37 C 37.4 C  Resp: 17     Last Pain:  Filed Vitals:   08/20/15 1026  PainSc: 7       Patients Stated Pain Goal: 3 ([phone] [id number])  Complications: No apparent anesthesia complications

## 2015-08-20 NOTE — Progress Notes (Signed)
*  PRELIMINARY RESULTS* Vascular Ultrasound Lower extremity venous duplex has been completed.  Preliminary findings: No evidence of DVT or baker's cyst.   Landry Mellow, RDMS, RVT  08/20/2015, 3:17 PM

## 2015-08-20 NOTE — Progress Notes (Signed)
Pt refuses CPAP  RT to monitor and assess as needed.  

## 2015-08-20 NOTE — Anesthesia Postprocedure Evaluation (Signed)
Anesthesia Post Note  Patient: Steve Ritter  Procedure(s) Performed: Procedure(s) (LRB): ESOPHAGOGASTRODUODENOSCOPY (EGD) (N/A)  Patient location during evaluation: Endoscopy Anesthesia Type: MAC Level of consciousness: awake and alert Pain management: pain level controlled Vital Signs Assessment: post-procedure vital signs reviewed and stable Respiratory status: spontaneous breathing, nonlabored ventilation, respiratory function stable and patient connected to nasal cannula oxygen Cardiovascular status: stable and blood pressure returned to baseline Anesthetic complications: no    Last Vitals:  Filed Vitals:   08/20/15 1200 08/20/15 1221  BP: 122/71 124/86  Pulse: 79 75  Temp:  36.8 C  Resp: 21 20    Last Pain:  Filed Vitals:   08/20/15 1222  PainSc: 7                  Montez Hageman

## 2015-08-20 NOTE — Op Note (Signed)
[facility] Patient Name: Steve Ritter Procedure Date: 08/20/2015 MRN: [id number] Attending MD: [name] , MD Date of Birth: [birth year] CSN: [id number] Age: 50 Admit Type: Inpatient Procedure:                Upper GI endoscopy Indications:              Abdominal pain in the right upper quadrant,                            Abdominal pain in the right lower quadrant,                            Abnormal CT of the GI tract, fevers Providers:                Lajuan Lines. [name], MD, Dustin Flock, RN, Alfonso Patten,                            Technician, Delena Bali, CRNA, [facility], CRNA Referring MD:              Medicines:                Monitored Anesthesia Care Complications:            No immediate complications. Estimated Blood Loss:     Estimated blood loss: none. Procedure:                Pre-Anesthesia Assessment:                           - Prior to the procedure, a History and Physical                            was performed, and patient medications and                            allergies were reviewed. The patient's tolerance of                            previous anesthesia was also reviewed. The risks                            and benefits of the procedure and the sedation                            options and risks were discussed with the patient.                            All questions were answered, and informed consent                            was obtained. Prior Anticoagulants: The patient has  taken no previous anticoagulant or antiplatelet                            agents. ASA Grade Assessment: II - A patient with                            mild systemic disease. After reviewing the risks                            and benefits, the patient was deemed in                            satisfactory condition to undergo the procedure.                           After obtaining informed  consent, the endoscope was                            passed under direct vision. Throughout the                            procedure, the patient's blood pressure, pulse, and                            oxygen saturations were monitored continuously. The                            [id number]) scope was introduced through                            the mouth, and advanced to the fourth part of                            duodenum. The upper GI endoscopy was accomplished                            without difficulty. The patient tolerated the                            procedure well. Scope In: Scope Out: Findings:      The examined esophagus was normal.      The entire examined stomach was normal.      The cardia and gastric fundus were normal on retroflexion.      A single small angioectasia without bleeding was found in the third       portion of the duodenum.      Otherwise normal mucosa was found in the entire duodenum without       evidence of duodenitis or significant narrowing. Impression:               - Normal esophagus.                           - Normal stomach.                           -  A single non-bleeding angioectasia in the                            duodenum.                           - Otherwise normal mucosa was found in the entire                            examined duodenum.                           - No specimens collected. Moderate Sedation:      N/A Recommendation:           - Return patient to hospital ward for ongoing care.                           - NPO.                           - Perform MRI abdomen with MRCP today given                            fluctuating bilirubin, unexplained fevers                            (suspicion for possible CBD stone causing elevated                            total bilirubin, fevers, and inflammation seen by                            CT).                           - Continue empiric antibiotics for now.                            - Continue present medications. Procedure Code(s):        --- Professional ---                           [phone], Esophagogastroduodenoscopy, flexible,                            transoral; diagnostic, including collection of                            specimen(s) by brushing or washing, when performed                            (separate procedure) Diagnosis Code(s):        --- Professional ---                           [id number], Angiodysplasia of stomach and duodenum  without bleeding                           R10.11, Right upper quadrant pain                           R10.31, Right lower quadrant pain                           R93.3, Abnormal findings on diagnostic imaging of                            other parts of digestive tract CPT copyright 2016 [employer]. All rights reserved. The codes documented in this report are preliminary and upon coder review may  be revised to meet current compliance requirements. [name], MD 08/20/2015 11:37:06 AM This report has been signed electronically. Number of Addenda: 0

## 2015-08-21 ENCOUNTER — Encounter (HOSPITAL_COMMUNITY): Payer: Self-pay | Admitting: Internal Medicine

## 2015-08-21 DIAGNOSIS — R1011 Right upper quadrant pain: Secondary | ICD-10-CM

## 2015-08-21 LAB — COMPREHENSIVE METABOLIC PANEL
ALBUMIN: 2.7 g/dL — AB (ref 3.5–5.0)
ALK PHOS: 52 U/L (ref 38–126)
ALT: 24 U/L (ref 17–63)
AST: 25 U/L (ref 15–41)
Anion gap: 10 (ref 5–15)
BILIRUBIN TOTAL: 0.9 mg/dL (ref 0.3–1.2)
BUN: 14 mg/dL (ref 6–20)
CO2: 23 mmol/L (ref 22–32)
Calcium: 8.5 mg/dL — ABNORMAL LOW (ref 8.9–10.3)
Chloride: 105 mmol/L (ref 101–111)
Creatinine, Ser: 0.68 mg/dL (ref 0.61–1.24)
GFR calc Af Amer: >60 mL/min (ref >60)
GFR calc non Af Amer: >60 mL/min (ref >60)
GLUCOSE: 159 mg/dL — AB (ref 65–99)
Potassium: 4 mmol/L (ref 3.5–5.1)
Sodium: 138 mmol/L (ref 135–145)
TOTAL PROTEIN: 5.8 g/dL — AB (ref 6.5–8.1)

## 2015-08-21 LAB — CBC
HEMATOCRIT: 36.2 % — AB (ref 39.0–52.0)
Hemoglobin: 12.3 g/dL — ABNORMAL LOW (ref 13.0–17.0)
MCH: 32.2 pg (ref 26.0–34.0)
MCHC: 34 g/dL (ref 30.0–36.0)
MCV: 94.8 fL (ref 78.0–100.0)
PLATELETS: 181 10*3/uL (ref 150–400)
RBC: 3.82 MIL/uL — AB (ref 4.22–5.81)
RDW: 12.5 % (ref 11.5–15.5)
WBC: 8.7 10*3/uL (ref 4.0–10.5)

## 2015-08-21 LAB — GLUCOSE, CAPILLARY
GLUCOSE-CAPILLARY: 181 mg/dL — AB (ref 65–99)
Glucose-Capillary: 168 mg/dL — ABNORMAL HIGH (ref 65–99)
Glucose-Capillary: 120 mg/dL — ABNORMAL HIGH (ref 65–99)
Glucose-Capillary: 192 mg/dL — ABNORMAL HIGH (ref 65–99)

## 2015-08-21 LAB — LIPASE, BLOOD: Lipase: 28 U/L (ref 11–51)

## 2015-08-21 MED ORDER — OXYCODONE HCL 5 MG PO TABS
5.0000 mg | ORAL_TABLET | Freq: Four times a day (QID) | ORAL | Status: DC | PRN
  Administered 2015-08-21 – 2015-08-22 (×4): 10 mg via ORAL
  Filled 2015-08-21 (×4): qty 2

## 2015-08-21 NOTE — Progress Notes (Signed)
Progress Note   Subjective  Still with right middle abdominal pain which when lying still seems slightly improved. He has not been out of bed much Tolerated V8 juice and grits for breakfast without increase in pain No nausea Low grade fever, but not over 100.5   Objective  Vital signs in last 24 hours: Temp:  [98.3 F (36.8 C)-100.8 F (38.2 C)] 99.1 F (37.3 C) (05/11 0613) Pulse Rate:  [73-83] 73 (05/11 0613) Resp:  [15-21] 16 (05/11 0613) BP: (90-127)/(58-86) 103/62 mmHg (05/11 0613) SpO2:  [95 %-99 %] 96 % (05/11 0613) Last BM Date: 08/21/15 Gen: awake, alert, NAD HEENT: anicteric, op clear CV: RRR, no mrg Pulm: CTA b/l Abd: soft, tender to palp in the right middle and lower abd without rebound or guarding, ND, +BS throughout Ext: no c/c/e Neuro: nonfocal    Intake/Output from previous day: 05/10 0701 - 05/11 0700 In: 1400 [I.V.:1400] Out: -  Intake/Output this shift: Total I/O In: 640 [P.O.:240; I.V.:400] Out: -   Lab Results:  Recent Labs  08/19/15 0428 08/20/15 0433 08/21/15 0437  WBC 10.5 10.8* 8.7  HGB 13.2 13.2 12.3*  HCT 37.7* 38.1* 36.2*  PLT 146* 173 181   BMET  Recent Labs  08/19/15 0428 08/20/15 0433 08/21/15 0437  NA 136 136 138  K 3.8 3.8 4.0  CL 105 102 105  CO2 22 23 23   GLUCOSE 192* 193* 159*  BUN 10 14 14   CREATININE 0.81 0.78 0.68  CALCIUM 8.4* 8.6* 8.5*   LFT  Recent Labs  08/21/15 0437  PROT 5.8*  ALBUMIN 2.7*  AST 25  ALT 24  ALKPHOS 52  BILITOT 0.9   Studies/Results: Mr 3d Recon At Scanner  08/20/2015  CLINICAL DATA:  Low abdominal pain for several days. Abnormal liver function studies. Upper endoscopy today. Evaluate for common duct stone versus appendicitis. EXAM: MRI ABDOMEN WITHOUT AND WITH CONTRAST (INCLUDING MRCP) TECHNIQUE: Multiplanar multisequence MR imaging of the abdomen was performed both before and after the administration of intravenous contrast. Heavily T2-weighted images of the biliary and  pancreatic ducts were obtained, and three-dimensional MRCP images were rendered by post processing. CONTRAST:  64mL MULTIHANCE GADOBENATE DIMEGLUMINE 529 MG/ML IV SOLN COMPARISON:  Abdominal pelvic CT 08/17/2015. FINDINGS: Lower chest: Small bilateral pleural effusions and mild dependent atelectasis at both lung bases. There is no significant pericardial effusion. Hepatobiliary: The liver demonstrates severe steatosis with loss of signal on gradient echo opposed phase images. 6 mm T2 hyperintense lesion centrally in the right hepatic lobe (image 25 of series 3) is not well seen post-contrast and is probably a small hemangioma. There are no suspicious hepatic findings. There is no intra or extrahepatic biliary dilatation. There is no evidence of choledocholithiasis. The gallbladder appears normal. Pancreas: The pancreas is normal in signal and demonstrates homogeneous enhancement following contrast. There is no pancreatic ductal dilatation. As on recent CT, there are inflammatory changes around the pancreatic head, body and duodenal C-loop which have not significantly changed. Spleen: Normal in size without focal abnormality. Adrenals/Urinary Tract: Both adrenal glands appear normal. Both kidneys appear normal without evidence of mass lesion, abnormal enhancement or hydronephrosis. There is mild asymmetric perinephric soft tissue stranding on the right. Stomach/Bowel: As seen on recent CT, there is duodenal wall thickening with surrounding inflammation. There is a small amount of peripherally enhancing fluid posterior to the second and third portions of the duodenum, best seen on the delayed post-contrast images. This fluid collection measures up to 3.7  x 2.5 cm transverse on image 62 of series 14 O2. Appearance is similar to recent CT. The remainder the visualized small and large bowel appear unremarkable. The cecum and appendix are incompletely visualized by this examination. The appendix is partially imaged on the  coronal images and appears unchanged, removed from the epicenter of the inflammatory process. Vascular/Lymphatic: Small portacaval nodes are not pathologically enlarged and stable. There is no retroperitoneal lymphadenopathy. No significant vascular findings are present. The portal, superior mesenteric and splenic veins are patent. Other: No ascites or free intraperitoneal air identified. The visualized anterior abdominal wall appears normal. There is mild asymmetric right flank edema. Musculoskeletal: No acute or significant osseous findings. IMPRESSION: 1. Negative MRCP. No evidence of biliary dilatation or choledocholithiasis. 2. No significant change in appearance of the inflammatory process centered around the duodenal C-loop. There are persistent small extraluminal fluid collections with peripheral enhancement posterior to the duodenum. The etiology for these findings is uncertain in this patient with negative upper endoscopy and normal lipase levels, although sequela of recent pancreatitis would be a consideration. 3. Severe hepatic steatosis. 4. The appendix is not well evaluated by this examination, although in conjunction with recent CT appears stable and does not appear to be the source of the inflammation. Electronically Signed   By: Richardean Sale M.D.   On: 08/20/2015 15:12   Mr Abd W/wo Cm/mrcp  08/20/2015  CLINICAL DATA:  Low abdominal pain for several days. Abnormal liver function studies. Upper endoscopy today. Evaluate for common duct stone versus appendicitis. EXAM: MRI ABDOMEN WITHOUT AND WITH CONTRAST (INCLUDING MRCP) TECHNIQUE: Multiplanar multisequence MR imaging of the abdomen was performed both before and after the administration of intravenous contrast. Heavily T2-weighted images of the biliary and pancreatic ducts were obtained, and three-dimensional MRCP images were rendered by post processing. CONTRAST:  34mL MULTIHANCE GADOBENATE DIMEGLUMINE 529 MG/ML IV SOLN COMPARISON:  Abdominal  pelvic CT 08/17/2015. FINDINGS: Lower chest: Small bilateral pleural effusions and mild dependent atelectasis at both lung bases. There is no significant pericardial effusion. Hepatobiliary: The liver demonstrates severe steatosis with loss of signal on gradient echo opposed phase images. 6 mm T2 hyperintense lesion centrally in the right hepatic lobe (image 25 of series 3) is not well seen post-contrast and is probably a small hemangioma. There are no suspicious hepatic findings. There is no intra or extrahepatic biliary dilatation. There is no evidence of choledocholithiasis. The gallbladder appears normal. Pancreas: The pancreas is normal in signal and demonstrates homogeneous enhancement following contrast. There is no pancreatic ductal dilatation. As on recent CT, there are inflammatory changes around the pancreatic head, body and duodenal C-loop which have not significantly changed. Spleen: Normal in size without focal abnormality. Adrenals/Urinary Tract: Both adrenal glands appear normal. Both kidneys appear normal without evidence of mass lesion, abnormal enhancement or hydronephrosis. There is mild asymmetric perinephric soft tissue stranding on the right. Stomach/Bowel: As seen on recent CT, there is duodenal wall thickening with surrounding inflammation. There is a small amount of peripherally enhancing fluid posterior to the second and third portions of the duodenum, best seen on the delayed post-contrast images. This fluid collection measures up to 3.7 x 2.5 cm transverse on image 62 of series 14 O2. Appearance is similar to recent CT. The remainder the visualized small and large bowel appear unremarkable. The cecum and appendix are incompletely visualized by this examination. The appendix is partially imaged on the coronal images and appears unchanged, removed from the epicenter of the inflammatory process. Vascular/Lymphatic: Small  portacaval nodes are not pathologically enlarged and stable. There is  no retroperitoneal lymphadenopathy. No significant vascular findings are present. The portal, superior mesenteric and splenic veins are patent. Other: No ascites or free intraperitoneal air identified. The visualized anterior abdominal wall appears normal. There is mild asymmetric right flank edema. Musculoskeletal: No acute or significant osseous findings. IMPRESSION: 1. Negative MRCP. No evidence of biliary dilatation or choledocholithiasis. 2. No significant change in appearance of the inflammatory process centered around the duodenal C-loop. There are persistent small extraluminal fluid collections with peripheral enhancement posterior to the duodenum. The etiology for these findings is uncertain in this patient with negative upper endoscopy and normal lipase levels, although sequela of recent pancreatitis would be a consideration. 3. Severe hepatic steatosis. 4. The appendix is not well evaluated by this examination, although in conjunction with recent CT appears stable and does not appear to be the source of the inflammation. Electronically Signed   By: Richardean Sale M.D.   On: 08/20/2015 15:12      Assessment & Plan  50 year old with history of hypertension, sleep apnea presenting with right middle and lower quadrant abdominal pain felt secondary to pancreatitis after CT, MRI with MRCP and upper endoscopy  1. Right middle and lower abd pain -- no evidence for duodenitis and after MRI/MRCP images reviewed symptoms felt secondary to pancreatitis originating in the pancreatic head. This is very near the duodenal C-loop and there is inflammatory fluid in this area. Precipitant for pancreatitis is unclear as he drinks alcohol rarely and there is been no evidence of gallstones or CBD obstruction.  Interestingly lipase has been normal (uncommon, but certainly not unheard of) --Supportive care for now with IV fluids and pain control. Consider oral pain medications with IV pain medication for breakthrough or  uncontrolled pain --Liberal fluid intake --Advance diet as tolerated --Repeat CT pancreas protocol recommended in 6-8 weeks to ensure resolution of inflammation and exclude underlying pancreatic head pathology  Principal Problem:   Duodenitis Active Problems:   Essential hypertension   Abdominal pain   Right sided abdominal pain   Fever   Abnormal CT scan, small bowel     LOS: 4 days   Eugenie Harewood M  08/21/2015, 11:38 AM

## 2015-08-21 NOTE — Progress Notes (Signed)
PROGRESS NOTE  TORRI HIRT [id number] DOB: 1965-08-20 DOA: 08/17/2015 PCP: No primary care provider on file.  HPI/Recap of past 54 hours: 50 year old male past history of sleep apnea and essential hypertension admitted on 5/7 with 2 days of lower quadrant abdominal pain. No nausea or vomiting or diarrhea. CT scan noted inflammatory changes in the upper abdomen concerning for duodenitis versus pancreatitis. Lipase level normal. Patient also noted to have mild leukocytosis with white count of 14. Following admission, spiked temperature of 102. No evidence of sepsis. Started on IV antibiotics and fluids.  Over the next few days, white count normalized. Diet advanced.  Patient still continues to have pain, and again ,spiking fevers despite abx 5-09. GI consulted. Had  endoscopy 5-10, normal, no acute finding. MRCP negative, maybe sequela of pancreatitis.  He report abdominal pain 8, yesterday was at 10. Pain get worse with meals. Advised to be careful with eating.    Assessment/Plan:  Abdominal Pain: Probably related to pancreatitis. MRCP no evidence of pancreatic duct obstruction.  Ct abdomen with normal apendice.  Discussed with Dr [name], will stop IV antibiotics. Monitor off antibiotics.  Continue with IV fluids. Current diet. Oxycodone for pain controlled.    Positive D dimer:  Patient denies chest pan or dyspnea.  Positive D dimer might be related to acute illness, phase reactant.  Doppler LE negative for DVT.  Patient would like to hold on performing CT angio at this time.   Diabetes Mellitus type 2, uncontrolled: new dx.  Confirmed by A1c at 9.5.   Diabetes educator as started education. Upon discharge, we'll start metformin.   Screening for HIV ordered.   Essential hypertension: Blood pressure stable Sleep apnea: Stable  Constipation: Noted on CT scan. Had BM.   Obesity: Patient meets criteria with BMI greater than 30     Code Status: Full code   Family  Communication; care discussed with patient.   Disposition Plan: Pending GI evaluation   Consultants:  GI  Procedures:   None  Antimicrobials:  IV Cipro and Flagyl: 5/18-present  IV cefepime and vancomycin 5/7-5/8--5-11  DVT prophylaxis:  Lovenox   Objective: Filed Vitals:   08/20/15 1221 08/20/15 1447 08/20/15 2117 08/21/15 0613  BP: 124/86 127/83 90/65 103/62  Pulse: 75 79 83 73  Temp: 98.3 F (36.8 C) 98.3 F (36.8 C) 100.8 F (38.2 C) 99.1 F (37.3 C)  TempSrc: Oral Oral Oral Oral  Resp: 20 20 18 16   Height:      Weight:      SpO2: 99% 97% 95% 96%    Intake/Output Summary (Last 24 hours) at 08/21/15 1203 Last data filed at 08/21/15 1000  Gross per 24 hour  Intake   1840 ml  Output      0 ml  Net   1840 ml   Filed Weights   08/18/15 0610  Weight: 106.731 kg (235 lb 4.8 oz)    Exam:   General:  Alert and oriented 3 no distress  Cardiovascular: Regular rate and rhythm, S1-S2   Respiratory: Clear to auscultation bilaterally   Abdomen: Soft, mild distention, tenderness right side abdomen.   Musculoskeletal: No clubbing or cyanosis or edema    Data Reviewed: CBC:  Recent Labs Lab 08/17/15 1720 08/18/15 0437 08/19/15 0428 08/20/15 0433 08/21/15 0437  WBC 14.6* 11.7* 10.5 10.8* 8.7  HGB 16.7 13.9 13.2 13.2 12.3*  HCT 45.2 39.7 37.7* 38.1* 36.2*  MCV 90.8 91.9 93.5 94.1 94.8  PLT 164 143* 146* 173  0000000   Basic Metabolic Panel:  Recent Labs Lab 08/17/15 1752 08/18/15 0437 08/19/15 0428 09/02/2015 0433 08/21/15 0437  NA 132* 133* 136 136 138  K 4.6 4.0 3.8 3.8 4.0  CL 102 102 105 102 105  CO2 20* 22 22 23 23   GLUCOSE 168* 257* 192* 193* 159*  BUN 12 11 10 14 14   CREATININE 0.77 0.87 0.81 0.78 0.68  CALCIUM 8.4* 8.5* 8.4* 8.6* 8.5*   GFR: Estimated Creatinine Clearance: 142.5 mL/min (by C-G formula based on Cr of 0.68). Liver Function Tests:  Recent Labs Lab 08/18/15 0019 08/18/15 0437 08/19/15 0428 02-Sep-2015 0433  08/21/15 0437  AST 18 17 14* 17 25  ALT 30 32 23 23 24   ALKPHOS 63 65 54 56 52  BILITOT 1.4* 1.1 1.2 1.6* 0.9  PROT 5.8* 6.1* 5.8* 6.4* 5.8*  ALBUMIN 3.1* 3.0* 2.9* 2.9* 2.7*    Recent Labs Lab 08/17/15 1752 08/18/15 0437 08/21/15 0437  LIPASE 36 32 28   No results for input(s): AMMONIA in the last 168 hours. Coagulation Profile: No results for input(s): INR, PROTIME in the last 168 hours. Cardiac Enzymes:  Recent Labs Lab 08/17/15 2145  TROPONINI <0.03   BNP (last 3 results) No results for input(s): PROBNP in the last 8760 hours. HbA1C: No results for input(s): HGBA1C in the last 72 hours. CBG:  Recent Labs Lab 09-02-15 0813 02-Sep-2015 1301 09-02-15 1626 09-02-2015 2136 08/21/15 0737  GLUCAP 193* 176* 155* 135* 168*   Lipid Profile: No results for input(s): CHOL, HDL, LDLCALC, TRIG, CHOLHDL, LDLDIRECT in the last 72 hours. Thyroid Function Tests: No results for input(s): TSH, T4TOTAL, FREET4, T3FREE, THYROIDAB in the last 72 hours. Anemia Panel: No results for input(s): VITAMINB12, FOLATE, FERRITIN, TIBC, IRON, RETICCTPCT in the last 72 hours. Urine analysis:    Component Value Date/Time   COLORURINE AMBER* 08/17/2015 1821   APPEARANCEUR CLOUDY* 08/17/2015 1821   LABSPEC 1.018 08/17/2015 1821   PHURINE 6.0 08/17/2015 1821   GLUCOSEU 500* 08/17/2015 1821   HGBUR TRACE* 08/17/2015 1821   BILIRUBINUR SMALL* 08/17/2015 1821   KETONESUR >80* 08/17/2015 1821   PROTEINUR NEGATIVE 08/17/2015 1821   NITRITE NEGATIVE 08/17/2015 1821   LEUKOCYTESUR NEGATIVE 08/17/2015 1821   Sepsis Labs: @LABRCNTIP (procalcitonin:4,lacticidven:4)  ) Recent Results (from the past 240 hour(s))  Culture, blood (routine x 2)     Status: None (Preliminary result)   Collection Time: 08/18/15  6:55 AM  Result Value Ref Range Status   Specimen Description BLOOD LEFT ARM  Final   Special Requests BOTTLES DRAWN AEROBIC AND ANAEROBIC 10CC  Final   Culture   Final    NO GROWTH 2  DAYS Performed at [facility]    Report Status PENDING  Incomplete  Culture, blood (routine x 2)     Status: None (Preliminary result)   Collection Time: 08/18/15  6:55 AM  Result Value Ref Range Status   Specimen Description BLOOD RIGHT HAND  Final   Special Requests BOTTLES DRAWN AEROBIC AND ANAEROBIC [place]  Final   Culture   Final    NO GROWTH 2 DAYS Performed at [facility]    Report Status PENDING  Incomplete      Studies: Mr 3d Recon At Scanner  09-02-15  CLINICAL DATA:  Low abdominal pain for several days. Abnormal liver function studies. Upper endoscopy today. Evaluate for common duct stone versus appendicitis. EXAM: MRI ABDOMEN WITHOUT AND WITH CONTRAST (INCLUDING MRCP) TECHNIQUE: Multiplanar multisequence MR imaging of the abdomen  was performed both before and after the administration of intravenous contrast. Heavily T2-weighted images of the biliary and pancreatic ducts were obtained, and three-dimensional MRCP images were rendered by post processing. CONTRAST:  75mL MULTIHANCE GADOBENATE DIMEGLUMINE 529 MG/ML IV SOLN COMPARISON:  Abdominal pelvic CT 08/17/2015. FINDINGS: Lower chest: Small bilateral pleural effusions and mild dependent atelectasis at both lung bases. There is no significant pericardial effusion. Hepatobiliary: The liver demonstrates severe steatosis with loss of signal on gradient echo opposed phase images. 6 mm T2 hyperintense lesion centrally in the right hepatic lobe (image 25 of series 3) is not well seen post-contrast and is probably a small hemangioma. There are no suspicious hepatic findings. There is no intra or extrahepatic biliary dilatation. There is no evidence of choledocholithiasis. The gallbladder appears normal. Pancreas: The pancreas is normal in signal and demonstrates homogeneous enhancement following contrast. There is no pancreatic ductal dilatation. As on recent CT, there are inflammatory changes around the pancreatic head, body  and duodenal C-loop which have not significantly changed. Spleen: Normal in size without focal abnormality. Adrenals/Urinary Tract: Both adrenal glands appear normal. Both kidneys appear normal without evidence of mass lesion, abnormal enhancement or hydronephrosis. There is mild asymmetric perinephric soft tissue stranding on the right. Stomach/Bowel: As seen on recent CT, there is duodenal wall thickening with surrounding inflammation. There is a small amount of peripherally enhancing fluid posterior to the second and third portions of the duodenum, best seen on the delayed post-contrast images. This fluid collection measures up to 3.7 x 2.5 cm transverse on image 62 of series 14 O2. Appearance is similar to recent CT. The remainder the visualized small and large bowel appear unremarkable. The cecum and appendix are incompletely visualized by this examination. The appendix is partially imaged on the coronal images and appears unchanged, removed from the epicenter of the inflammatory process. Vascular/Lymphatic: Small portacaval nodes are not pathologically enlarged and stable. There is no retroperitoneal lymphadenopathy. No significant vascular findings are present. The portal, superior mesenteric and splenic veins are patent. Other: No ascites or free intraperitoneal air identified. The visualized anterior abdominal wall appears normal. There is mild asymmetric right flank edema. Musculoskeletal: No acute or significant osseous findings. IMPRESSION: 1. Negative MRCP. No evidence of biliary dilatation or choledocholithiasis. 2. No significant change in appearance of the inflammatory process centered around the duodenal C-loop. There are persistent small extraluminal fluid collections with peripheral enhancement posterior to the duodenum. The etiology for these findings is uncertain in this patient with negative upper endoscopy and normal lipase levels, although sequela of recent pancreatitis would be a  consideration. 3. Severe hepatic steatosis. 4. The appendix is not well evaluated by this examination, although in conjunction with recent CT appears stable and does not appear to be the source of the inflammation. Electronically Signed   By: Richardean Sale M.D.   On: 08/20/2015 15:12   Mr Abd W/wo Cm/mrcp  08/20/2015  CLINICAL DATA:  Low abdominal pain for several days. Abnormal liver function studies. Upper endoscopy today. Evaluate for common duct stone versus appendicitis. EXAM: MRI ABDOMEN WITHOUT AND WITH CONTRAST (INCLUDING MRCP) TECHNIQUE: Multiplanar multisequence MR imaging of the abdomen was performed both before and after the administration of intravenous contrast. Heavily T2-weighted images of the biliary and pancreatic ducts were obtained, and three-dimensional MRCP images were rendered by post processing. CONTRAST:  63mL MULTIHANCE GADOBENATE DIMEGLUMINE 529 MG/ML IV SOLN COMPARISON:  Abdominal pelvic CT 08/17/2015. FINDINGS: Lower chest: Small bilateral pleural effusions and mild dependent atelectasis at  both lung bases. There is no significant pericardial effusion. Hepatobiliary: The liver demonstrates severe steatosis with loss of signal on gradient echo opposed phase images. 6 mm T2 hyperintense lesion centrally in the right hepatic lobe (image 25 of series 3) is not well seen post-contrast and is probably a small hemangioma. There are no suspicious hepatic findings. There is no intra or extrahepatic biliary dilatation. There is no evidence of choledocholithiasis. The gallbladder appears normal. Pancreas: The pancreas is normal in signal and demonstrates homogeneous enhancement following contrast. There is no pancreatic ductal dilatation. As on recent CT, there are inflammatory changes around the pancreatic head, body and duodenal C-loop which have not significantly changed. Spleen: Normal in size without focal abnormality. Adrenals/Urinary Tract: Both adrenal glands appear normal. Both  kidneys appear normal without evidence of mass lesion, abnormal enhancement or hydronephrosis. There is mild asymmetric perinephric soft tissue stranding on the right. Stomach/Bowel: As seen on recent CT, there is duodenal wall thickening with surrounding inflammation. There is a small amount of peripherally enhancing fluid posterior to the second and third portions of the duodenum, best seen on the delayed post-contrast images. This fluid collection measures up to 3.7 x 2.5 cm transverse on image 62 of series 14 O2. Appearance is similar to recent CT. The remainder the visualized small and large bowel appear unremarkable. The cecum and appendix are incompletely visualized by this examination. The appendix is partially imaged on the coronal images and appears unchanged, removed from the epicenter of the inflammatory process. Vascular/Lymphatic: Small portacaval nodes are not pathologically enlarged and stable. There is no retroperitoneal lymphadenopathy. No significant vascular findings are present. The portal, superior mesenteric and splenic veins are patent. Other: No ascites or free intraperitoneal air identified. The visualized anterior abdominal wall appears normal. There is mild asymmetric right flank edema. Musculoskeletal: No acute or significant osseous findings. IMPRESSION: 1. Negative MRCP. No evidence of biliary dilatation or choledocholithiasis. 2. No significant change in appearance of the inflammatory process centered around the duodenal C-loop. There are persistent small extraluminal fluid collections with peripheral enhancement posterior to the duodenum. The etiology for these findings is uncertain in this patient with negative upper endoscopy and normal lipase levels, although sequela of recent pancreatitis would be a consideration. 3. Severe hepatic steatosis. 4. The appendix is not well evaluated by this examination, although in conjunction with recent CT appears stable and does not appear to be  the source of the inflammation. Electronically Signed   By: Richardean Sale M.D.   On: 08/20/2015 15:12    Scheduled Meds: . enoxaparin (LOVENOX) injection  40 mg Subcutaneous Q24H  . insulin aspart  0-5 Units Subcutaneous QHS  . insulin aspart  0-9 Units Subcutaneous TID WC  . pantoprazole  40 mg Oral Q0600  . sildenafil  20 mg Oral TID    Continuous Infusions: . sodium chloride 100 mL/hr at 08/21/15 0112     LOS: 4 days   Time spent: 25 minutes  Elmarie Shiley, MD [facility] Pager [phone]  If 7PM-7AM, please contact night-coverage [email or web address] Password [email or web address] 08/21/2015, 12:03 PM

## 2015-08-21 NOTE — Plan of Care (Signed)
Problem: Food- and Nutrition-Related Knowledge Deficit (NB-1.1) Goal: Nutrition education Formal process to instruct or train a patient/client in a skill or to impart knowledge to help patients/clients voluntarily manage or modify food choices and eating behavior to maintain or improve health. Outcome: Completed/Met Date Met:  08/21/15  RD consulted for nutrition education regarding diabetes.     Lab Results  Component Value Date    HGBA1C 9.8* 08/18/2015    RD provided "Carbohydrate Counting for People with Diabetes" handout from the Academy of [employer] and Dietetics and "My Plate" handout. Discussed different food groups and their effects on blood sugar, emphasizing carbohydrate-containing foods. Provided list of carbohydrates and recommended serving sizes of common foods.  Discussed importance of controlled and consistent carbohydrate intake throughout the day. Provided examples of ways to balance meals/snacks and encouraged intake of high-fiber, whole grain complex carbohydrates. Teach back method used.  Expect good compliance with follow-up at a outpatient facility. Pt reports drinking [employer] drinks and sweet tea. Discouraged sugar sweetened beverages and encouraged pt to cut back on his energy drinks.   Body mass index is 31.91 kg/(m^2). Pt meets criteria for obesity based on current BMI.  Current diet order is Full liquid, patient is consuming approximately 25% of meals at this time. Labs and medications reviewed. No further nutrition interventions warranted at this time.  If additional nutrition issues arise, please re-consult RD.  Clayton Bibles, MS, RD, LDN Pager: [phone] After Hours Pager: [phone]

## 2015-08-22 DIAGNOSIS — K859 Acute pancreatitis without necrosis or infection, unspecified: Secondary | ICD-10-CM

## 2015-08-22 DIAGNOSIS — K8500 Idiopathic acute pancreatitis without necrosis or infection: Secondary | ICD-10-CM

## 2015-08-22 LAB — GLUCOSE, CAPILLARY
GLUCOSE-CAPILLARY: 160 mg/dL — AB (ref 65–99)
Glucose-Capillary: 195 mg/dL — ABNORMAL HIGH (ref 65–99)

## 2015-08-22 MED ORDER — METFORMIN HCL 500 MG PO TABS: 500.0000 mg | ORAL_TABLET | Freq: Two times a day (BID) | ORAL | Status: DC

## 2015-08-22 MED ORDER — BLOOD GLUCOSE MONITOR KIT: PACK | Status: AC

## 2015-08-22 MED ORDER — OXYCODONE HCL 5 MG PO TABS: 5.0000 mg | ORAL_TABLET | Freq: Four times a day (QID) | ORAL | Status: DC | PRN

## 2015-08-22 NOTE — Discharge Instructions (Signed)
Do not drive or operate machine while taking OxyContin, pain medications.  No heavy lifting.

## 2015-08-22 NOTE — Care Management Note (Signed)
Case Management Note  Patient Details  Name: Steve Ritter MRN: [id number] Date of Birth: Jan 28, 1966  Subjective/Objective:  Admitted with abd pain, new onset diabetes                  Action/Plan: Discharge planning, spoke with patient at bedside. Received order to assist with establishing with PCP. Patient states he does have a PCP, Dr.Ted Nefoug in [facility], he says that is too far for him to go. Discussed with him to maintain that relationship until he can establish with new PCP. He states that his girlfriend has a doctor that he would like to see. He plans to call, encouraged him to call his customer care number on insurance if that did not work, also offered to provide him with [facility] # to find PCP. He then became angry and states he "just wanted to get the hell out of here". I asked if there was anything else I could help him with, he said he has not had any education on his diabetes however it is noted that he has seen diabetes educator and dietitian. Spoke with RN and she states he has but she will f/u with him as well.   Expected Discharge Date:  08/20/15               Expected Discharge Plan:  Home/Self Care  In-House Referral:  NA, PCP / [facility]  Discharge planning Services  CM Consult  Post Acute Care Choice:  NA Choice offered to:  NA  DME Arranged:  N/A DME Agency:  NA  HH Arranged:  NA HH Agency:  NA  Status of Service:  Completed, signed off  Medicare Important Message Given:    Date Medicare IM Given:    Medicare IM give by:    Date Additional Medicare IM Given:    Additional Medicare Important Message give by:     If discussed at [place] of Stay Meetings, dates discussed:    Additional Comments:  Guadalupe Maple, RN 08/22/2015, 11:34 AM

## 2015-08-22 NOTE — Progress Notes (Signed)
[facility] Progress Note  Subjective:  No more fevers.  Still has pain, but it is controlled with PO pain meds.  Wants to go home.    Objective:  Vital signs in last 24 hours: Temp:  [98.7 F (37.1 C)-99.3 F (37.4 C)] 98.7 F (37.1 C) (05/12 0529) Pulse Rate:  [72-76] 73 (05/12 0529) Resp:  [15-18] 18 (05/12 0529) BP: (110-131)/(78-80) 122/80 mmHg (05/12 0529) SpO2:  [96 %-100 %] 100 % (05/12 0529) Last BM Date: 08/21/15 General:  Alert, Well-developed, in NAD Heart:  Regular rate and rhythm; no murmurs Pulm:  CTAB.  No W/R/R. Abdomen:  Soft, non-distended.  BS present.  Mild epigastric and RLQ TTP. Extremities:  Without edema. Neurologic:  Alert and   oriented x 4;  grossly normal neurologically. Psych:  Alert and cooperative. Normal mood and affect.  Intake/Output from previous day: 05/11 0701 - 05/12 0700 In: 2227.5 [P.O.:600; I.V.:1627.5] Out: -   Lab Results:  Recent Labs  08/20/15 0433 08/21/15 0437  WBC 10.8* 8.7  HGB 13.2 12.3*  HCT 38.1* 36.2*  PLT 173 181   BMET  Recent Labs  08/20/15 0433 08/21/15 0437  NA 136 138  K 3.8 4.0  CL 102 105  CO2 23 23  GLUCOSE 193* 159*  BUN 14 14  CREATININE 0.78 0.68  CALCIUM 8.6* 8.5*   LFT  Recent Labs  08/21/15 0437  PROT 5.8*  ALBUMIN 2.7*  AST 25  ALT 24  ALKPHOS 52  BILITOT 0.9   Mr 3d Recon At Scanner  08/20/2015  CLINICAL DATA:  Low abdominal pain for several days. Abnormal liver function studies. Upper endoscopy today. Evaluate for common duct stone versus appendicitis. EXAM: MRI ABDOMEN WITHOUT AND WITH CONTRAST (INCLUDING MRCP) TECHNIQUE: Multiplanar multisequence MR imaging of the abdomen was performed both before and after the administration of intravenous contrast. Heavily T2-weighted images of the biliary and pancreatic ducts were obtained, and three-dimensional MRCP images were rendered by post processing. CONTRAST:  52mL MULTIHANCE GADOBENATE DIMEGLUMINE 529 MG/ML IV  SOLN COMPARISON:  Abdominal pelvic CT 08/17/2015. FINDINGS: Lower chest: Small bilateral pleural effusions and mild dependent atelectasis at both lung bases. There is no significant pericardial effusion. Hepatobiliary: The liver demonstrates severe steatosis with loss of signal on gradient echo opposed phase images. 6 mm T2 hyperintense lesion centrally in the right hepatic lobe (image 25 of series 3) is not well seen post-contrast and is probably a small hemangioma. There are no suspicious hepatic findings. There is no intra or extrahepatic biliary dilatation. There is no evidence of choledocholithiasis. The gallbladder appears normal. Pancreas: The pancreas is normal in signal and demonstrates homogeneous enhancement following contrast. There is no pancreatic ductal dilatation. As on recent CT, there are inflammatory changes around the pancreatic head, body and duodenal C-loop which have not significantly changed. Spleen: Normal in size without focal abnormality. Adrenals/Urinary Tract: Both adrenal glands appear normal. Both kidneys appear normal without evidence of mass lesion, abnormal enhancement or hydronephrosis. There is mild asymmetric perinephric soft tissue stranding on the right. Stomach/Bowel: As seen on recent CT, there is duodenal wall thickening with surrounding inflammation. There is a small amount of peripherally enhancing fluid posterior to the second and third portions of the duodenum, best seen on the delayed post-contrast images. This fluid collection measures up to 3.7 x 2.5 cm transverse on image 62 of series 14 O2. Appearance is similar to recent CT. The remainder the visualized small and large bowel appear unremarkable. The  cecum and appendix are incompletely visualized by this examination. The appendix is partially imaged on the coronal images and appears unchanged, removed from the epicenter of the inflammatory process. Vascular/Lymphatic: Small portacaval nodes are not pathologically  enlarged and stable. There is no retroperitoneal lymphadenopathy. No significant vascular findings are present. The portal, superior mesenteric and splenic veins are patent. Other: No ascites or free intraperitoneal air identified. The visualized anterior abdominal wall appears normal. There is mild asymmetric right flank edema. Musculoskeletal: No acute or significant osseous findings. IMPRESSION: 1. Negative MRCP. No evidence of biliary dilatation or choledocholithiasis. 2. No significant change in appearance of the inflammatory process centered around the duodenal C-loop. There are persistent small extraluminal fluid collections with peripheral enhancement posterior to the duodenum. The etiology for these findings is uncertain in this patient with negative upper endoscopy and normal lipase levels, although sequela of recent pancreatitis would be a consideration. 3. Severe hepatic steatosis. 4. The appendix is not well evaluated by this examination, although in conjunction with recent CT appears stable and does not appear to be the source of the inflammation. Electronically Signed   By: Richardean Sale M.D.   On: 08/20/2015 15:12   Mr Abd W/wo Cm/mrcp  08/20/2015  CLINICAL DATA:  Low abdominal pain for several days. Abnormal liver function studies. Upper endoscopy today. Evaluate for common duct stone versus appendicitis. EXAM: MRI ABDOMEN WITHOUT AND WITH CONTRAST (INCLUDING MRCP) TECHNIQUE: Multiplanar multisequence MR imaging of the abdomen was performed both before and after the administration of intravenous contrast. Heavily T2-weighted images of the biliary and pancreatic ducts were obtained, and three-dimensional MRCP images were rendered by post processing. CONTRAST:  60mL MULTIHANCE GADOBENATE DIMEGLUMINE 529 MG/ML IV SOLN COMPARISON:  Abdominal pelvic CT 08/17/2015. FINDINGS: Lower chest: Small bilateral pleural effusions and mild dependent atelectasis at both lung bases. There is no significant  pericardial effusion. Hepatobiliary: The liver demonstrates severe steatosis with loss of signal on gradient echo opposed phase images. 6 mm T2 hyperintense lesion centrally in the right hepatic lobe (image 25 of series 3) is not well seen post-contrast and is probably a small hemangioma. There are no suspicious hepatic findings. There is no intra or extrahepatic biliary dilatation. There is no evidence of choledocholithiasis. The gallbladder appears normal. Pancreas: The pancreas is normal in signal and demonstrates homogeneous enhancement following contrast. There is no pancreatic ductal dilatation. As on recent CT, there are inflammatory changes around the pancreatic head, body and duodenal C-loop which have not significantly changed. Spleen: Normal in size without focal abnormality. Adrenals/Urinary Tract: Both adrenal glands appear normal. Both kidneys appear normal without evidence of mass lesion, abnormal enhancement or hydronephrosis. There is mild asymmetric perinephric soft tissue stranding on the right. Stomach/Bowel: As seen on recent CT, there is duodenal wall thickening with surrounding inflammation. There is a small amount of peripherally enhancing fluid posterior to the second and third portions of the duodenum, best seen on the delayed post-contrast images. This fluid collection measures up to 3.7 x 2.5 cm transverse on image 62 of series 14 O2. Appearance is similar to recent CT. The remainder the visualized small and large bowel appear unremarkable. The cecum and appendix are incompletely visualized by this examination. The appendix is partially imaged on the coronal images and appears unchanged, removed from the epicenter of the inflammatory process. Vascular/Lymphatic: Small portacaval nodes are not pathologically enlarged and stable. There is no retroperitoneal lymphadenopathy. No significant vascular findings are present. The portal, superior mesenteric and splenic veins are patent.  Other: No  ascites or free intraperitoneal air identified. The visualized anterior abdominal wall appears normal. There is mild asymmetric right flank edema. Musculoskeletal: No acute or significant osseous findings. IMPRESSION: 1. Negative MRCP. No evidence of biliary dilatation or choledocholithiasis. 2. No significant change in appearance of the inflammatory process centered around the duodenal C-loop. There are persistent small extraluminal fluid collections with peripheral enhancement posterior to the duodenum. The etiology for these findings is uncertain in this patient with negative upper endoscopy and normal lipase levels, although sequela of recent pancreatitis would be a consideration. 3. Severe hepatic steatosis. 4. The appendix is not well evaluated by this examination, although in conjunction with recent CT appears stable and does not appear to be the source of the inflammation. Electronically Signed   By: Richardean Sale M.D.   On: 08/20/2015 15:12   Assessment / Plan: 50 year old with history of hypertension, sleep apnea presenting with right middle and lower quadrant abdominal pain felt secondary to pancreatitis after CT, MRI with MRCP and upper endoscopy  1. Right middle and lower abd pain/fever -- no evidence for duodenitis on EGD and after MRI/MRCP images reviewed symptoms felt secondary to pancreatitis originating in the pancreatic head. This is very near the duodenal C-loop and there is inflammatory fluid in this area. Precipitant for pancreatitis is unclear as he drinks alcohol rarely and there is been no evidence of gallstones or CBD obstruction. Interestingly lipase has been normal (uncommon, but certainly not unheard of). --Ok for discharge today on low-fat diet. --Repeat CT pancreas protocol recommended in 6-8 weeks to ensure resolution of inflammation and exclude underlying pancreatic head pathology, which we will arrange.   LOS: 5 days   Kenndra Morris D.  08/22/2015, 9:02 AM  Pager  number [phone]

## 2015-08-22 NOTE — Progress Notes (Signed)
Discharge instructions given to patient along with prescriptions.

## 2015-08-22 NOTE — Discharge Summary (Signed)
Physician Discharge Summary  Steve Ritter [id number] DOB: 09/22/1965 DOA: 08/17/2015  PCP: No primary care provider on file.  Admit date: 08/17/2015 Discharge date: 08/22/2015  Time spent: 35 minutes  Recommendations for Outpatient Follow-up:  1. Needs repeat HbA1c. And further adjustment of diabetes regimen.  2. Needs follow up imaging of pancreas.  3. Follow HIV results.    Discharge Diagnoses:    Pancreatitis, acute   Essential hypertension   Abdominal pain   Right sided abdominal pain   Fever   Abnormal CT scan, small bowel    Discharge Condition: stable.   Diet recommendation: carb modified diet   Filed Weights   08/18/15 0610  Weight: 106.731 kg (235 lb 4.8 oz)    History of present illness:  HPI: Steve Ritter is a 50 y.o. male with medical history significant of hypertension, sleep apnea, Peyronie's disease presents to the ER because of abdominal pain. Patient has any abdominal pain mostly in the lower quadrants over the last 3 days. Denies any nausea vomiting or diarrhea. Patient's pain is constant and dull aching. Has no relation to food. Denies any new medications or sick contacts. Patient drinks alcohol very occasionally. In the ER labs revealed mildly elevated total bilirubin and CT of the abdomen and pelvis show features concerning for inflammatory changes involving the upper abdomen concerning for duodenitis versus pancreatitis. Lipase was normal. EKG was showing normal sinus rhythm with negative troponin. Patient has been admitted for further management of abdominal pain with possibilities including duodenitis versus pancreatitis.   Hospital Course:  Steve Ritter [id number] DOB: Aug 19, 1965 DOA: 08/17/2015 PCP: No primary care provider on file.  HPI/Recap of past 46 hours: 50 year old male past history of sleep apnea and essential hypertension admitted on 5/7 with 2 days of lower quadrant abdominal pain. No nausea or vomiting or diarrhea. CT scan noted  inflammatory changes in the upper abdomen concerning for duodenitis versus pancreatitis. Lipase level normal. Patient also noted to have mild leukocytosis with white count of 14. Following admission, spiked temperature of 102. No evidence of sepsis. Started on IV antibiotics and fluids.  Over the next few days, white count normalized. Diet advanced. Patient still continues to have pain, and again ,spiking fevers despite abx 5-09. GI consulted. Had endoscopy 5-10, normal, no acute finding. MRCP negative, maybe sequela of pancreatitis. He report abdominal pain 8, yesterday was at 10. Pain get worse with meals. Advised to be careful with eating.    Assessment/Plan:  Abdominal Pain: Probably related to Acute pancreatitis. MRCP no evidence of pancreatic duct obstruction.  Ct abdomen with normal apendice.  Discussed with Dr [name], will stop IV antibiotics. Monitor off antibiotics. patient has been afebrile.  Oxycodone for pain controlled.  Pain better, tolerating diet. Afebrile. No evidence of infection. He will need repeated Ct scan   Positive D dimer:  Patient denies chest pan or dyspnea.  Positive D dimer might be related to acute illness, phase reactant.  Doppler LE negative for DVT.  Patient would like to hold on performing CT angio at this time.   Diabetes Mellitus type 2, uncontrolled: new dx. Confirmed by A1c at 9.5. Diabetes educator as started education. Upon discharge, we'll start metformin.   Screening for HIV ordered. Pending.   Essential hypertension: Blood pressure stable Sleep apnea: Stable  Constipation: Noted on CT scan. Had BM.   Obesity: Patient meets criteria with BMI greater than 30  Procedures:  Endoscopy   Consultations:  GI, [facility].   Discharge Exam:  Filed Vitals:   08/21/15 2131 08/22/15 0529  BP: 131/80 122/80  Pulse: 76 73  Temp: 99.3 F (37.4 C) 98.7 F (37.1 C)  Resp: 16 18    General: NAD Cardiovascular: S 1, S 2  RRR Respiratory: CTA Abdomen; soft, mild tenderness  Discharge Instructions   Discharge Instructions    Diet Carb Modified    Complete by:  As directed      Increase activity slowly    Complete by:  As directed           Current Discharge Medication List    START taking these medications   Details  metFORMIN (GLUCOPHAGE) 500 MG tablet Take 1 tablet (500 mg total) by mouth 2 (two) times daily with a meal. Qty: 60 tablet, Refills: 0    oxyCODONE (OXY IR/ROXICODONE) 5 MG immediate release tablet Take 1-2 tablets (5-10 mg total) by mouth every 6 (six) hours as needed for moderate pain. Qty: 30 tablet, Refills: 0      CONTINUE these medications which have NOT CHANGED   Details  ALPRAZolam (XANAX) 0.5 MG tablet Take 1 tablet by mouth as needed. anxiety Refills: 1    aspirin EC 81 MG tablet Take 81 mg by mouth.    omeprazole (PRILOSEC) 10 MG capsule Take 10 mg by mouth daily.    polyethylene glycol (MIRALAX / GLYCOLAX) packet Take 1 packet by mouth daily.    varenicline (CHANTIX CONTINUING MONTH PAK) 1 MG tablet Take 1 tablet by mouth daily.    vitamin E 100 UNIT capsule Take by mouth.      STOP taking these medications     amLODipine (NORVASC) 5 MG tablet      oxyCODONE-acetaminophen (ROXICET) 5-325 MG tablet      sildenafil (REVATIO) 20 MG tablet      predniSONE (DELTASONE) 20 MG tablet        Allergies  Allergen Reactions  . Codeine   . Erythromycin   . Penicillins    Follow-up Information    Follow up with PYRTLE, Lajuan Lines, MD In 2 weeks.   Specialty:  Gastroenterology   Contact information:   [address] [place] [address] [phone]        The results of significant diagnostics from this hospitalization (including imaging, microbiology, ancillary and laboratory) are listed below for reference.    Significant Diagnostic Studies: Ct Abdomen Pelvis W Contrast  08/17/2015  CLINICAL DATA:  50 year old male with right lower quadrant abdominal  pain. Concern for appendicitis. EXAM: CT ABDOMEN AND PELVIS WITH CONTRAST TECHNIQUE: Multidetector CT imaging of the abdomen and pelvis was performed using the standard protocol following bolus administration of intravenous contrast. CONTRAST:  144mL ISOVUE-300 IOPAMIDOL (ISOVUE-300) INJECTION 61% COMPARISON:  Lumbar spine MRI dated 02/13/2015 FINDINGS: The visualized lung bases are clear. Is no intra-abdominal free air. Trace free fluid may be present within pelvis. There is diffuse hepatic steatosis with foci of fatty sparring the adjacent the gallbladder. Set the gallbladder appears unremarkable. There is inflammatory fluid centers in the epigastric area surrounding the pancreas and duodenal C-loop extending into the lower abdomen and pelvis. Sign this may be related to acute pancreatitis or duodenitis. Correlation with clinical exam and pancreatic enzymes recommended. There is no drainable fluid collection/abscess or pseudocyst. No pancreatic duct dilatation stop or gland atrophy. The spleen, adrenal glands appear unremarkable. There is a punctate nonobstructing left renal inferior pole calculus. There is a 4 mm nonobstructing right renal inferior pole calculus. No hydronephrosis on either  side. The visualized ureters and urinary bladder appear unremarkable. The prostate, and seminal vesicles are grossly unremarkable. Moderate stool noted throughout the colon. There is no evidence of bowel obstruction. Normal appendix. The abdominal aorta and IVC appear unremarkable. No portal venous gas identified. There is no adenopathy. The origins of the celiac axis, SMA, IMA as well as the origins of the renal arteries are patent. There is a small fat containing umbilical hernia. The abdominal wall soft tissues appear unremarkable. [age] there is mild degenerative changes of the spine. No acute fracture. IMPRESSION: Inflammatory changes of the upper abdomen involving the pancreas and duodenal C-loop with extension of the  inflammatory fluid into the lower abdomen. Findings represent acute pancreatitis or duodenitis. Correlation with clinical exam and pancreatic enzymes recommended. No evidence of bowel obstruction.  Normal appendix. A 4 mm nonobstructing right renal inferior pole calculus. Fatty liver. Electronically Signed   By: Anner Crete M.D.   On: 08/17/2015 19:13   Mr 3d Recon At Scanner  08/20/2015  CLINICAL DATA:  Low abdominal pain for several days. Abnormal liver function studies. Upper endoscopy today. Evaluate for common duct stone versus appendicitis. EXAM: MRI ABDOMEN WITHOUT AND WITH CONTRAST (INCLUDING MRCP) TECHNIQUE: Multiplanar multisequence MR imaging of the abdomen was performed both before and after the administration of intravenous contrast. Heavily T2-weighted images of the biliary and pancreatic ducts were obtained, and three-dimensional MRCP images were rendered by post processing. CONTRAST:  39mL MULTIHANCE GADOBENATE DIMEGLUMINE 529 MG/ML IV SOLN COMPARISON:  Abdominal pelvic CT 08/17/2015. FINDINGS: Lower chest: Small bilateral pleural effusions and mild dependent atelectasis at both lung bases. There is no significant pericardial effusion. Hepatobiliary: The liver demonstrates severe steatosis with loss of signal on gradient echo opposed phase images. 6 mm T2 hyperintense lesion centrally in the right hepatic lobe (image 25 of series 3) is not well seen post-contrast and is probably a small hemangioma. There are no suspicious hepatic findings. There is no intra or extrahepatic biliary dilatation. There is no evidence of choledocholithiasis. The gallbladder appears normal. Pancreas: The pancreas is normal in signal and demonstrates homogeneous enhancement following contrast. There is no pancreatic ductal dilatation. As on recent CT, there are inflammatory changes around the pancreatic head, body and duodenal C-loop which have not significantly changed. Spleen: Normal in size without focal  abnormality. Adrenals/Urinary Tract: Both adrenal glands appear normal. Both kidneys appear normal without evidence of mass lesion, abnormal enhancement or hydronephrosis. There is mild asymmetric perinephric soft tissue stranding on the right. Stomach/Bowel: As seen on recent CT, there is duodenal wall thickening with surrounding inflammation. There is a small amount of peripherally enhancing fluid posterior to the second and third portions of the duodenum, best seen on the delayed post-contrast images. This fluid collection measures up to 3.7 x 2.5 cm transverse on image 62 of series 14 O2. Appearance is similar to recent CT. The remainder the visualized small and large bowel appear unremarkable. The cecum and appendix are incompletely visualized by this examination. The appendix is partially imaged on the coronal images and appears unchanged, removed from the epicenter of the inflammatory process. Vascular/Lymphatic: Small portacaval nodes are not pathologically enlarged and stable. There is no retroperitoneal lymphadenopathy. No significant vascular findings are present. The portal, superior mesenteric and splenic veins are patent. Other: No ascites or free intraperitoneal air identified. The visualized anterior abdominal wall appears normal. There is mild asymmetric right flank edema. Musculoskeletal: No acute or significant osseous findings. IMPRESSION: 1. Negative MRCP. No evidence of biliary  dilatation or choledocholithiasis. 2. No significant change in appearance of the inflammatory process centered around the duodenal C-loop. There are persistent small extraluminal fluid collections with peripheral enhancement posterior to the duodenum. The etiology for these findings is uncertain in this patient with negative upper endoscopy and normal lipase levels, although sequela of recent pancreatitis would be a consideration. 3. Severe hepatic steatosis. 4. The appendix is not well evaluated by this examination,  although in conjunction with recent CT appears stable and does not appear to be the source of the inflammation. Electronically Signed   By: Richardean Sale M.D.   On: 08/20/2015 15:12   Dg Chest Port 1 View  08/18/2015  CLINICAL DATA:  Fever this morning. EXAM: PORTABLE CHEST 1 VIEW COMPARISON:  None. FINDINGS: A single AP portable view of the chest demonstrates no focal airspace consolidation or alveolar edema. The lungs are grossly clear. There is no large effusion or pneumothorax. Cardiac and mediastinal contours appear unremarkable. IMPRESSION: No active disease. Electronically Signed   By: Andreas Newport M.D.   On: 08/18/2015 06:12   Mr Abd W/wo Cm/mrcp  08/20/2015  CLINICAL DATA:  Low abdominal pain for several days. Abnormal liver function studies. Upper endoscopy today. Evaluate for common duct stone versus appendicitis. EXAM: MRI ABDOMEN WITHOUT AND WITH CONTRAST (INCLUDING MRCP) TECHNIQUE: Multiplanar multisequence MR imaging of the abdomen was performed both before and after the administration of intravenous contrast. Heavily T2-weighted images of the biliary and pancreatic ducts were obtained, and three-dimensional MRCP images were rendered by post processing. CONTRAST:  94mL MULTIHANCE GADOBENATE DIMEGLUMINE 529 MG/ML IV SOLN COMPARISON:  Abdominal pelvic CT 08/17/2015. FINDINGS: Lower chest: Small bilateral pleural effusions and mild dependent atelectasis at both lung bases. There is no significant pericardial effusion. Hepatobiliary: The liver demonstrates severe steatosis with loss of signal on gradient echo opposed phase images. 6 mm T2 hyperintense lesion centrally in the right hepatic lobe (image 25 of series 3) is not well seen post-contrast and is probably a small hemangioma. There are no suspicious hepatic findings. There is no intra or extrahepatic biliary dilatation. There is no evidence of choledocholithiasis. The gallbladder appears normal. Pancreas: The pancreas is normal in signal  and demonstrates homogeneous enhancement following contrast. There is no pancreatic ductal dilatation. As on recent CT, there are inflammatory changes around the pancreatic head, body and duodenal C-loop which have not significantly changed. Spleen: Normal in size without focal abnormality. Adrenals/Urinary Tract: Both adrenal glands appear normal. Both kidneys appear normal without evidence of mass lesion, abnormal enhancement or hydronephrosis. There is mild asymmetric perinephric soft tissue stranding on the right. Stomach/Bowel: As seen on recent CT, there is duodenal wall thickening with surrounding inflammation. There is a small amount of peripherally enhancing fluid posterior to the second and third portions of the duodenum, best seen on the delayed post-contrast images. This fluid collection measures up to 3.7 x 2.5 cm transverse on image 62 of series 14 O2. Appearance is similar to recent CT. The remainder the visualized small and large bowel appear unremarkable. The cecum and appendix are incompletely visualized by this examination. The appendix is partially imaged on the coronal images and appears unchanged, removed from the epicenter of the inflammatory process. Vascular/Lymphatic: Small portacaval nodes are not pathologically enlarged and stable. There is no retroperitoneal lymphadenopathy. No significant vascular findings are present. The portal, superior mesenteric and splenic veins are patent. Other: No ascites or free intraperitoneal air identified. The visualized anterior abdominal wall appears normal. There is mild asymmetric  right flank edema. Musculoskeletal: No acute or significant osseous findings. IMPRESSION: 1. Negative MRCP. No evidence of biliary dilatation or choledocholithiasis. 2. No significant change in appearance of the inflammatory process centered around the duodenal C-loop. There are persistent small extraluminal fluid collections with peripheral enhancement posterior to the  duodenum. The etiology for these findings is uncertain in this patient with negative upper endoscopy and normal lipase levels, although sequela of recent pancreatitis would be a consideration. 3. Severe hepatic steatosis. 4. The appendix is not well evaluated by this examination, although in conjunction with recent CT appears stable and does not appear to be the source of the inflammation. Electronically Signed   By: Richardean Sale M.D.   On: 08/20/2015 15:12    Microbiology: Recent Results (from the past 240 hour(s))  Culture, blood (routine x 2)     Status: None (Preliminary result)   Collection Time: 08/18/15  6:55 AM  Result Value Ref Range Status   Specimen Description BLOOD LEFT ARM  Final   Special Requests BOTTLES DRAWN AEROBIC AND ANAEROBIC 10CC  Final   Culture   Final    NO GROWTH 3 DAYS Performed at [facility]    Report Status PENDING  Incomplete  Culture, blood (routine x 2)     Status: None (Preliminary result)   Collection Time: 08/18/15  6:55 AM  Result Value Ref Range Status   Specimen Description BLOOD RIGHT HAND  Final   Special Requests BOTTLES DRAWN AEROBIC AND ANAEROBIC [place]  Final   Culture   Final    NO GROWTH 3 DAYS Performed at [facility]    Report Status PENDING  Incomplete     Labs: Basic Metabolic Panel:  Recent Labs Lab 08/17/15 1752 08/18/15 0437 08/19/15 0428 08/20/15 0433 08/21/15 0437  NA 132* 133* 136 136 138  K 4.6 4.0 3.8 3.8 4.0  CL 102 102 105 102 105  CO2 20* 22 22 23 23   GLUCOSE 168* 257* 192* 193* 159*  BUN 12 11 10 14 14   CREATININE 0.77 0.87 0.81 0.78 0.68  CALCIUM 8.4* 8.5* 8.4* 8.6* 8.5*   Liver Function Tests:  Recent Labs Lab 08/18/15 0019 08/18/15 0437 08/19/15 0428 08/20/15 0433 08/21/15 0437  AST 18 17 14* 17 25  ALT 30 32 23 23 24   ALKPHOS 63 65 54 56 52  BILITOT 1.4* 1.1 1.2 1.6* 0.9  PROT 5.8* 6.1* 5.8* 6.4* 5.8*  ALBUMIN 3.1* 3.0* 2.9* 2.9* 2.7*    Recent Labs Lab 08/17/15 1752  08/18/15 0437 08/21/15 0437  LIPASE 36 32 28   No results for input(s): AMMONIA in the last 168 hours. CBC:  Recent Labs Lab 08/17/15 1720 08/18/15 0437 08/19/15 0428 08/20/15 0433 08/21/15 0437  WBC 14.6* 11.7* 10.5 10.8* 8.7  HGB 16.7 13.9 13.2 13.2 12.3*  HCT 45.2 39.7 37.7* 38.1* 36.2*  MCV 90.8 91.9 93.5 94.1 94.8  PLT 164 143* 146* 173 181   Cardiac Enzymes:  Recent Labs Lab 08/17/15 2145  TROPONINI <0.03   BNP: BNP (last 3 results) No results for input(s): BNP in the last 8760 hours.  ProBNP (last 3 results) No results for input(s): PROBNP in the last 8760 hours.  CBG:  Recent Labs Lab 08/21/15 0737 08/21/15 1238 08/21/15 1639 08/21/15 2130 08/22/15 0721  GLUCAP 168* 181* 120* 192* 160*       Signed:  Niel Hummer A MD.  [facility] 08/22/2015, 10:37 AM

## 2015-08-23 LAB — CULTURE, BLOOD (ROUTINE X 2)
CULTURE: NO GROWTH
CULTURE: NO GROWTH

## 2015-08-25 ENCOUNTER — Telehealth: Payer: Self-pay | Admitting: Internal Medicine

## 2015-08-25 LAB — HIV ANTIBODY (ROUTINE TESTING W REFLEX): HIV Screen 4th Generation wRfx: NONREACTIVE

## 2015-08-26 ENCOUNTER — Ambulatory Visit (INDEPENDENT_AMBULATORY_CARE_PROVIDER_SITE_OTHER): Payer: 59 | Admitting: Internal Medicine

## 2015-08-26 VITALS — BP 130/88 | HR 77 | Temp 98.1°F | Resp 18 | Ht 72.0 in | Wt 223.2 lb

## 2015-08-26 DIAGNOSIS — R1013 Epigastric pain: Secondary | ICD-10-CM | POA: Diagnosis not present

## 2015-08-26 DIAGNOSIS — E119 Type 2 diabetes mellitus without complications: Secondary | ICD-10-CM

## 2015-08-26 DIAGNOSIS — K760 Fatty (change of) liver, not elsewhere classified: Secondary | ICD-10-CM | POA: Diagnosis not present

## 2015-08-26 DIAGNOSIS — R1011 Right upper quadrant pain: Secondary | ICD-10-CM | POA: Diagnosis not present

## 2015-08-26 DIAGNOSIS — K219 Gastro-esophageal reflux disease without esophagitis: Secondary | ICD-10-CM

## 2015-08-26 DIAGNOSIS — R1031 Right lower quadrant pain: Secondary | ICD-10-CM

## 2015-08-26 LAB — POCT CBC
GRANULOCYTE PERCENT: 59.1 % (ref 37–80)
HEMATOCRIT: 38.8 % — AB (ref 43.5–53.7)
Hemoglobin: 14 g/dL — AB (ref 14.1–18.1)
Lymph, poc: 2.5 (ref 0.6–3.4)
MCH, POC: 33.2 pg — AB (ref 27–31.2)
MCHC: 36.2 g/dL — AB (ref 31.8–35.4)
MCV: 91.8 fL (ref 80–97)
MID (CBC): 0.6 (ref 0–0.9)
MPV: 6.8 fL (ref 0–99.8)
POC GRANULOCYTE: 4.6 (ref 2–6.9)
POC LYMPH %: 32.7 % (ref 10–50)
POC MID %: 8.2 % (ref 0–12)
Platelet Count, POC: 379 10*3/uL (ref 142–424)
RBC: 4.22 M/uL — AB (ref 4.69–6.13)
RDW, POC: 12 %
WBC: 7.7 10*3/uL (ref 4.6–10.2)

## 2015-08-26 NOTE — Telephone Encounter (Signed)
Pt was discharged from [facility] and told to follow up for a Endo in 2 weeks

## 2015-08-26 NOTE — Patient Instructions (Signed)
     IF you received an x-ray today, you will receive an invoice from [facility]. Please contact [facility] at [phone] with questions or concerns regarding your invoice.   IF you received labwork today, you will receive an invoice from [facility]. Please contact [facility] at [phone] with questions or concerns regarding your invoice.   Our billing staff will not be able to assist you with questions regarding bills from these companies.  You will be contacted with the lab results as soon as they are available. The fastest way to get your results is to activate your My Chart account. Instructions are located on the last page of this paperwork. If you have not heard from us regarding the results in 2 weeks, please contact this office.      

## 2015-08-26 NOTE — Progress Notes (Signed)
By signing my name below, I, [name], attest that this documentation has been prepared under the direction and in the presence of [name], MD.  Electronically Signed: [name], Medical Scribe. 08/26/2015. 6:32 PM.  Subjective:    Patient ID: Steve Ritter, male    DOB: 09/29/1965, 50 y.o.   MRN: [id number]  HPI Chief Complaint  Patient presents with  . Follow-up    Post hospital visit, pancreatitis   203, 168 HPI Comments: Steve Ritter is a 50 y.o. male without a PCP who presents to the [facility] for hospital follow-up as he knows [name]. Onset of abdominal pain onset 11 days ago. He was discharged with a diagnosis of probable acute pancreatitis and new onset diabetes. He still feels abdominal pain but it's not as bad as the day of onset. Pt recalls the first day of having abdominal pain he was fine in the beginning, but the pain gradually worsened through the day. Pt mentions eventually being doubled over in pain unable to ambulate. His abdominal pain worsens with movement, and laying on his side. Has associated symptoms of unexpected weight loss, fever, increased thirst, "feeling drunk/woozy", and loss of appetite. H tries to eats breakfast, lunch, and dinner since discharge from the hospital--manages small amounts. His blood sugars have been ranging in the 150s-200's. Pt deines having PMHx of DM before incident. His mother's side of the family doesn't have DM, but he's unsure about his dad's side. Pt reports his FHx of a type of colitis. Pt denies nausea, emesis. Pt denies PMHx of kidney stones, gallstones, or blood clots. Pt reports a ruptured appendix was ruled out at the hospital. Pt denies having breath test to check for bacterial infection in his stomach.  Pt mentions needing to get his short term disability paper work filled out.  CT scan at time of admission showed inflammatory changes of the upper abdomen involving the pancreas and  the duodenum felt to represent duodenitis or acute pancreatitis. There is no evidence of bowel obstruction or appendicitis / there was a 4 mm obstructing right renal stone and he had a fatty liver When the fever spike to 102 with 14,000 WBCs he was started on antibiotics but all cultures subsequently negative Lipase was normal. Hemoglobin A1c was 9.8. D-dimer was positive is felt to be related to acuity of illness and lower extremity Dopplers were negative. HIV neg. ETOH not cause.  MRCP noted a normal gallbladder and common duct, pancreatic duct. Inflammatory changes could not be better delineated. Still pancreas versus duodenal. He improved well enough in response to liquid diet to go home with follow-up plan for Dr. [name].  He has had no further fever, no new chest or urinary symptoms.  He has a past history of significant GERD without prior evaluation. Patient Active Problem List   Diagnosis Date Noted  . Pancreatitis, acute 08/22/2015  . Abnormal CT scan, small bowel   . Right sided abdominal pain   . Fever   . Duodenitis 08/17/2015  . Abdominal pain 08/17/2015  . Essential hypertension 01/25/2015  . Erectile dysfunction 01/25/2015  . ANXIETY 11/30/2006   Past Medical History  Diagnosis Date  . Hypertension   . Obesity 08/2015    BMI 32.   Marland Kitchen Peyronie disease   . OSA (obstructive sleep apnea) 2015    dx per nerologist in [place].   . Fatty liver 08/2015  . Bloody diarrhea 06/2011    Left sided colitis  per CT. Colonoscopy with segmental colitis at 50 to 25 cm, propably ischemic.    Past Surgical History  Procedure Laterality Date  . Bilateral carpal tunnel release Bilateral   . Esophagogastroduodenoscopy N/A 08/20/2015    Procedure: ESOPHAGOGASTRODUODENOSCOPY (EGD);  Surgeon: [name], MD;  Location: [facility];  Service: Endoscopy;  Laterality: N/A;  . Colonoscopy  06/2011    [name], [place].  acute colitis at 50 to 25 cm, propably ischemic.  removed  small rectal polyp (hyperplastic on path)    Allergies  Allergen Reactions  . Codeine   . Erythromycin   . Penicillins    Prior to Admission medications   Medication Sig Start Date End Date Taking? Authorizing Provider  ALPRAZolam Duanne Moron) 0.5 MG tablet Take 1 tablet by mouth as needed. anxiety 07/28/15  Yes Historical Provider, MD  aspirin EC 81 MG tablet Take 81 mg by mouth.   Yes Historical Provider, MD  blood glucose meter kit and supplies KIT Dispense based on patient and insurance preference. Use up to four times daily as directed. (FOR ICD-9 250.00, 250.01). 08/22/15  Yes Belkys A Regalado, MD  metFORMIN (GLUCOPHAGE) 500 MG tablet Take 1 tablet (500 mg total) by mouth 2 (two) times daily with a meal. 08/22/15  Yes Belkys A Regalado, MD  omeprazole (PRILOSEC) 10 MG capsule Take 10 mg by mouth daily.   Yes Historical Provider, MD  oxyCODONE (OXY IR/ROXICODONE) 5 MG immediate release tablet Take 1-2 tablets (5-10 mg total) by mouth every 6 (six) hours as needed for moderate pain. 08/22/15  Yes Belkys A Regalado, MD  polyethylene glycol (MIRALAX / GLYCOLAX) packet Take 1 packet by mouth daily.   Yes Historical Provider, MD  varenicline (CHANTIX CONTINUING MONTH PAK) 1 MG tablet Take 1 tablet by mouth daily. 09/08/12  Yes Historical Provider, MD  vitamin E 100 UNIT capsule Take by mouth.   Yes Historical Provider, MD   Social History   Social History  . Marital Status: Single    Spouse Name: N/A  . Number of Children: N/A  . Years of Education: N/A   Occupational History  . Not on file.   Social History Main Topics  . Smoking status: Former Research scientist (life sciences)  . Smokeless tobacco: Not on file  . Alcohol Use: Not on file  . Drug Use: Not on file  . Sexual Activity: Not on file   Other Topics Concern  . Not on file   Social History Narrative   Review of Systems  Constitutional: Positive for appetite change and unexpected weight change (lost about 11lbs). Negative for fever.  Respiratory:  Negative for cough and shortness of breath.   Gastrointestinal: Positive for abdominal pain and diarrhea. Negative for nausea, vomiting and constipation.  Genitourinary: Negative for dysuria, urgency and difficulty urinating.  Musculoskeletal: Negative for arthralgias.  Skin: Negative for rash (past 6 months).  Neurological: Positive for dizziness. Negative for headaches.   Objective:  BP 130/88 mmHg  Pulse 77  Temp(Src) 98.1 F (36.7 C) (Oral)  Resp 18  Ht 6' (1.829 m)  Wt 223 lb 3.2 oz (101.243 kg)  BMI 30.26 kg/m2  SpO2 98% Wt Readings from Last 3 Encounters:  08/26/15 223 lb 3.2 oz (101.243 kg)  08/18/15 235 lb 4.8 oz (106.731 kg)  01/25/15 246 lb (111.585 kg)    Physical Exam  Constitutional: He is oriented to person, place, and time. He appears well-developed and well-nourished. No distress.  HENT:  Head: Normocephalic and atraumatic.  Eyes:  Conjunctivae are normal. Pupils are equal, round, and reactive to light.  Neck: Neck supple.  Cardiovascular: Normal rate, normal heart sounds and intact distal pulses.   No murmur heard. Pulmonary/Chest: Effort normal and breath sounds normal.  Abdominal: Bowel sounds are normal. He exhibits no distension. There is tenderness in the right lower quadrant and epigastric area.  Tender in RLQ ,RUQ and epigastric area but no percussion or rebound tenderness  Musculoskeletal: He exhibits no edema.  Neurological: He is alert and oriented to person, place, and time. No cranial nerve deficit.  Skin: Skin is warm and dry.  Psychiatric: He has a normal mood and affect. His behavior is normal.  Nursing note and vitals reviewed.   Assessment & Plan:  Abdominal pain, epigastric  Right lower quadrant abdominal pain Abdominal pain, RUQ Steatosis of liver Gastroesophageal reflux disease without esophagitis Type 2 diabetes mellitus without complication, without long-term current use of insulin (HCC) HTN ED Smoker Hx colitis by  colonoscopy    Plan:  -H. pylori breath test, Comprehensive metabolic panel, Amylase, Lipase, POCT CBC, Sedimentation Rate,  -increase metformin to 2x500 bid -f/u GI -will need PCP(Dr L and I both retire this month)   I have completed the patient encounter in its entirety as documented by the scribe, with editing by me where necessary. Yanai Hobson P. Laney Pastor, M.D.  Adden: labs Results for orders placed or performed in visit on 08/26/15  Comprehensive metabolic panel  Result Value Ref Range   Sodium 141 135 - 146 mmol/L   Potassium 5.2 3.5 - 5.3 mmol/L   Chloride 104 98 - 110 mmol/L   CO2 23 20 - 31 mmol/L   Glucose, Bld 124 (H) 65 - 99 mg/dL   BUN 16 7 - 25 mg/dL   Creat 0.86 0.60 - 1.35 mg/dL   Total Bilirubin 0.4 0.2 - 1.2 mg/dL   Alkaline Phosphatase 58 40 - 115 U/L   AST 30 10 - 40 U/L   ALT 49 (H) 9 - 46 U/L   Total Protein 6.7 6.1 - 8.1 g/dL   Albumin 3.8 3.6 - 5.1 g/dL   Calcium 9.5 8.6 - 10.3 mg/dL  Amylase  Result Value Ref Range   Amylase 51 0 - 105 U/L  Lipase  Result Value Ref Range   Lipase 78 (H) 7 - 60 U/L  Sedimentation Rate  Result Value Ref Range   Sed Rate 38 (H) 0 - 15 mm/hr  POCT CBC  Result Value Ref Range   WBC 7.7 4.6 - 10.2 K/uL   Lymph, poc 2.5 0.6 - 3.4   POC LYMPH PERCENT 32.7 10 - 50 %L   MID (cbc) 0.6 0 - 0.9   POC MID % 8.2 0 - 12 %M   POC Granulocyte 4.6 2 - 6.9   Granulocyte percent 59.1 37 - 80 %G   RBC 4.22 (A) 4.69 - 6.13 M/uL   Hemoglobin 14.0 (A) 14.1 - 18.1 g/dL   HCT, POC 38.8 (A) 43.5 - 53.7 %   MCV 91.8 80 - 97 fL   MCH, POC 33.2 (A) 27 - 31.2 pg   MCHC 36.2 (A) 31.8 - 35.4 g/dL   RDW, POC 12.0 %   Platelet Count, POC 379 142 - 424 K/uL   MPV 6.8 0 - 99.8 fL   Awaiting H. pylori breath test This still favors pancreatic origin with unknown etiology

## 2015-08-27 LAB — COMPREHENSIVE METABOLIC PANEL
ALT: 49 U/L — ABNORMAL HIGH (ref 9–46)
AST: 30 U/L (ref 10–40)
Albumin: 3.8 g/dL (ref 3.6–5.1)
Alkaline Phosphatase: 58 U/L (ref 40–115)
BILIRUBIN TOTAL: 0.4 mg/dL (ref 0.2–1.2)
BUN: 16 mg/dL (ref 7–25)
CO2: 23 mmol/L (ref 20–31)
CREATININE: 0.86 mg/dL (ref 0.60–1.35)
Calcium: 9.5 mg/dL (ref 8.6–10.3)
Chloride: 104 mmol/L (ref 98–110)
GLUCOSE: 124 mg/dL — AB (ref 65–99)
Potassium: 5.2 mmol/L (ref 3.5–5.3)
SODIUM: 141 mmol/L (ref 135–146)
Total Protein: 6.7 g/dL (ref 6.1–8.1)

## 2015-08-27 LAB — AMYLASE: Amylase: 51 U/L (ref 0–105)

## 2015-08-27 LAB — LIPASE: Lipase: 78 U/L — ABNORMAL HIGH (ref 7–60)

## 2015-08-27 NOTE — Telephone Encounter (Signed)
Pt states he was discharged from the hospital and told to follow up for an endo in 2 weeks. Please advise.

## 2015-08-27 NOTE — Telephone Encounter (Signed)
Pt scheduled to see Alonza Bogus PA 09/11/15@9am . Pt aware.

## 2015-08-27 NOTE — Telephone Encounter (Signed)
Does not need Endo, but rather followup for pancreatitis Can see Janett Billow in 2-3 weeks, then me thereafter

## 2015-08-28 ENCOUNTER — Telehealth: Payer: Self-pay | Admitting: *Deleted

## 2015-08-28 LAB — H. PYLORI BREATH TEST: H. PYLORI BREATH TEST: NOT DETECTED

## 2015-08-28 LAB — SEDIMENTATION RATE: Sed Rate: 38 mm/hr — ABNORMAL HIGH (ref 0–15)

## 2015-08-28 NOTE — Telephone Encounter (Signed)
Paper work found and pt notified and results given.

## 2015-08-28 NOTE — Telephone Encounter (Signed)
Pt would like results from 08/26/15.

## 2015-08-28 NOTE — Telephone Encounter (Signed)
Did you have any disability paperwork for this pt?  I did not see any paperwork in your box or at FMLA/disability box.

## 2015-08-28 NOTE — Telephone Encounter (Signed)
Pt brought in paperwork for disability and gave to Dr. Laney Pastor during office visit.  Can we make sure everything is filled out and sent.  I don't think pt paid for form being filled out.

## 2015-08-28 NOTE — Telephone Encounter (Signed)
Lab should have a message--except awaiting hpylori which just returned  Most labs ok Low level irritation of pancreas and liver present but unclear what to do except wait on GI eval hpylori negative Labs sent to Dr [name] who will plan next step--he could see me or see Dr L if something comes up in the meantime Dr [name] trying to arrange quick f/u with his PA i think

## 2015-09-01 ENCOUNTER — Telehealth: Payer: Self-pay | Admitting: *Deleted

## 2015-09-01 NOTE — Telephone Encounter (Signed)
Will need follow up if not getting well

## 2015-09-01 NOTE — Telephone Encounter (Signed)
Pt was told to call if he had any issues.  He has a headache twice in one night.  He has taken ibuprofen for pain.  Is there anything you would like for him to do differently?  [phone]

## 2015-09-01 NOTE — Telephone Encounter (Signed)
Paperwork scanned and faxed to the company number on the forms on 09/01/15

## 2015-09-01 NOTE — Telephone Encounter (Signed)
Pt advised.

## 2015-09-05 ENCOUNTER — Ambulatory Visit (INDEPENDENT_AMBULATORY_CARE_PROVIDER_SITE_OTHER): Payer: 59 | Admitting: Physician Assistant

## 2015-09-05 ENCOUNTER — Ambulatory Visit (HOSPITAL_COMMUNITY)
Admission: RE | Admit: 2015-09-05 | Discharge: 2015-09-05 | Disposition: A | Discharge status: Home / Self Care | Payer: 59 | Source: Ambulatory Visit | Attending: Physician Assistant | Admitting: Physician Assistant

## 2015-09-05 ENCOUNTER — Telehealth: Payer: Self-pay | Admitting: Physician Assistant

## 2015-09-05 VITALS — BP 118/60 | HR 71 | Temp 98.4°F | Resp 17 | Ht 72.0 in | Wt 226.0 lb

## 2015-09-05 DIAGNOSIS — R109 Unspecified abdominal pain: Secondary | ICD-10-CM

## 2015-09-05 DIAGNOSIS — R51 Headache: Secondary | ICD-10-CM | POA: Diagnosis not present

## 2015-09-05 DIAGNOSIS — R519 Headache, unspecified: Secondary | ICD-10-CM

## 2015-09-05 DIAGNOSIS — E119 Type 2 diabetes mellitus without complications: Secondary | ICD-10-CM | POA: Diagnosis not present

## 2015-09-05 MED ORDER — METFORMIN HCL 500 MG PO TABS: 1000.0000 mg | ORAL_TABLET | Freq: Two times a day (BID) | ORAL | Status: DC

## 2015-09-05 MED ORDER — CANAGLIFLOZIN 100 MG PO TABS: 100.0000 mg | ORAL_TABLET | Freq: Every day | ORAL | Status: DC

## 2015-09-05 MED ORDER — TOPIRAMATE 50 MG PO TABS: 50.0000 mg | ORAL_TABLET | Freq: Every day | ORAL | Status: DC

## 2015-09-05 MED ORDER — GLUCOSE BLOOD VI STRP: ORAL_STRIP | Status: DC

## 2015-09-05 NOTE — Addendum Note (Signed)
Addended by: Fara Chute on: 09/05/2015 05:44 PM   Modules accepted: Orders

## 2015-09-05 NOTE — Patient Instructions (Addendum)
CT scan scheduled at [facility], Please check in and they will work you in to have scan done.    IF you received an x-ray today, you will receive an invoice from [facility]. Please contact [facility] at [phone] with questions or concerns regarding your invoice.   IF you received labwork today, you will receive an invoice from [employer]. Please contact [facility] at [phone] with questions or concerns regarding your invoice.   Our billing staff will not be able to assist you with questions regarding bills from these companies.  You will be contacted with the lab results as soon as they are available. The fastest way to get your results is to activate your My Chart account. Instructions are located on the last page of this paperwork. If you have not heard from [place] regarding the results in 2 weeks, please contact this office.

## 2015-09-05 NOTE — Progress Notes (Signed)
Patient ID: Steve Ritter, male    DOB: 1965/08/07, 50 y.o.   MRN: [id number]  PCP: No primary care provider on file.  Subjective:   Chief Complaint  Patient presents with  . Medication Refill    metformin, blood glucose meter kit, and supplies   . Headache    HPI Presents for evaluation of headache, and needs test strips.  This patient was seen here 5/16 for hospital follow-up. He had presented to the ED on 5/07 with progressively worsening abdominal pain. He was discharged with a diagnosis of probable acute pancreatitis and new onset DM. CT scan at time of admission showed inflammatory changes of the upper abdomen involving the pancreas and the duodenum felt to represent duodenitis or acute pancreatitis. There is no evidence of bowel obstruction or appendicitis / there was a 4 mm obstructing right renal stone and he had a fatty liver When the fever spike to 102 with 14,000 WBCs he was started on antibiotics but all cultures were subsequently negative. Lipase was normal. Hemoglobin A1c was 9.8. D-dimer was positive is felt to be related to acuity of illness and lower extremity Dopplers were negative. HIV neg. ETOH not cause. MRCP noted a normal gallbladder and common duct, pancreatic duct. Inflammatory changes could not be better delineated. Still pancreas versus duodenal. He improved well enough in response to liquid diet to go home with follow-up plan for Dr. [name] on 09/11/15.  At the 5/16 visit he was improved, but continued to have pain in the epigastrum and RLQ. Metformin dose was increased to 1000 mg BID. Breath H pylori test was negative. CBC revealed improved Hgb (mild anemia during hospitalization), CMET was normal except ALT minimally elevated at 49,  Amylase normal at 51, Lipase elevated at 78. ESR was mildly elevated at 38.  Today he reports 2 weeks of headache on the RIGHT side of the head. It occurs like clock-work at 11:30 pm and 1:30 am and wakes him from sleep. Rates 8/10.  Doesn't take anything, and it resolves in 30-40 minutes. Last night the 11:30 headache lasted until 1 am.  The headache starts just under the lateral eye, extending up behind the RIGHT eye and to the top of the head. The pressure in the eye is severe and he feels like he can't see. The eye waters and is somewhat sensitive to light. No associated nausea, dizziness. Remote history of migraine headache, which felt different from this. He has a history of sinus headache, but has had no sinus congestion, runny nose, etc associated with these headaches. He doesn't know if the pain increases with leaning over, because he stays in bed when they occur.  He needs test strips. Glucose running 120's-300, though most readings below 200. Wants to add Invokana, which he's seen advertised on television.   Review of Systems  Constitutional: Positive for appetite change. Negative for fever and chills.  HENT: Negative for congestion, rhinorrhea and sinus pressure.   Eyes: Positive for photophobia, pain, discharge and visual disturbance. Negative for redness and itching.  Respiratory: Negative for cough, choking, shortness of breath and wheezing.   Cardiovascular: Negative for chest pain, palpitations and leg swelling.  Gastrointestinal: Positive for abdominal pain (4/10, "tolerable"). Negative for nausea, vomiting, diarrhea and constipation.  Genitourinary: Negative for dysuria, urgency, frequency and hematuria.  Musculoskeletal: Negative for myalgias and arthralgias.  Skin: Negative.   Neurological: Positive for headaches. Negative for dizziness, facial asymmetry, weakness, light-headedness and numbness.  Hematological: Negative for adenopathy. Does not  bruise/bleed easily.  Psychiatric/Behavioral: Positive for sleep disturbance.       Patient Active Problem List   Diagnosis Date Noted  . Pancreatitis, acute 08/22/2015  . Abnormal CT scan, small bowel   . Right sided abdominal pain   . Fever   .  Duodenitis 08/17/2015  . Abdominal pain 08/17/2015  . Essential hypertension 01/25/2015  . Erectile dysfunction 01/25/2015  . 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 ANXIETY 11/30/2006     Prior to Admission medications   Medication Sig Start Date End Date Taking? Authorizing Provider  ALPRAZolam Duanne Moron) 0.5 MG tablet Take 1 tablet by mouth as needed. anxiety 07/28/15  Yes Historical Provider, MD  amLODipine (NORVASC) 5 MG tablet Take 5 mg by mouth daily. 07/21/15  Yes Historical Provider, MD  aspirin EC 81 MG tablet Take 81 mg by mouth.   Yes Historical Provider, MD  blood glucose meter kit and supplies KIT Dispense based on patient and insurance preference. Use up to four times daily as directed. (FOR ICD-9 250.00, 250.01). 08/22/15  Yes Belkys A Regalado, MD  metFORMIN (GLUCOPHAGE) 500 MG tablet Take 1 tablet (500 mg total) by mouth 2 (two) times daily with a meal. 08/22/15  Yes Belkys A Regalado, MD  omeprazole (PRILOSEC) 10 MG capsule Take 10 mg by mouth daily. Reported on 09/05/2015   Yes Historical Provider, MD  omeprazole (PRILOSEC) 40 MG capsule Take 40 mg by mouth daily. 08/04/15  Yes Historical Provider, MD  polyethylene glycol (MIRALAX / GLYCOLAX) packet Take 1 packet by mouth daily.   Yes Historical Provider, MD  varenicline (CHANTIX CONTINUING MONTH PAK) 1 MG tablet Take 1 tablet by mouth daily. 09/08/12  Yes Historical Provider, MD  vitamin E 100 UNIT capsule Take by mouth.   Yes Historical Provider, MD  oxyCODONE (OXY IR/ROXICODONE) 5 MG immediate release tablet Take 1-2 tablets (5-10 mg total) by mouth every 6 (six) hours as needed for moderate pain. Patient not taking: Reported on 09/05/2015 08/22/15   Elmarie Shiley, MD     Allergies  Allergen Reactions  . Codeine   . Erythromycin   . Penicillins        Objective:  Physical Exam  Constitutional: He is oriented to person, place, and time. He appears well-developed and well-nourished. He is active and  cooperative. No distress.  BP 118/60 mmHg  Pulse 71  Temp(Src) 98.4 F (36.9 C) (Oral)  Resp 17  Ht 6' (1.829 m)  Wt 226 lb (102.513 kg)  BMI 30.64 kg/m2  SpO2 98%  HENT:  Head: Normocephalic and atraumatic.  Right Ear: Hearing, tympanic membrane, external ear and ear canal normal.  Left Ear: Hearing, tympanic membrane, external ear and ear canal normal.  Nose: Right sinus exhibits maxillary sinus tenderness and frontal sinus tenderness. Left sinus exhibits no maxillary sinus tenderness and no frontal sinus tenderness.  Mouth/Throat: Uvula is midline, oropharynx is clear and moist and mucous membranes are normal. No oral lesions. Normal dentition.  Eyes: Conjunctivae, EOM and lids are normal. Pupils are equal, round, and reactive to light. No scleral icterus.  Fundoscopic exam:      The right eye shows no hemorrhage and no papilledema. The right eye shows red reflex.       The left eye shows no hemorrhage and no papilledema. The left eye shows red reflex.  Photophobic in the RIGHT eye  Neck: Normal range of motion, full passive range of motion without pain and phonation normal. Neck supple. No thyromegaly  present.  Cardiovascular: Normal rate, regular rhythm and normal heart sounds.   Pulses:      Radial pulses are 2+ on the right side, and 2+ on the left side.  Pulmonary/Chest: Effort normal and breath sounds normal.  Lymphadenopathy:       Head (right side): No tonsillar, no preauricular, no posterior auricular and no occipital adenopathy present.       Head (left side): No tonsillar, no preauricular, no posterior auricular and no occipital adenopathy present.    He has no cervical adenopathy.       Right: No supraclavicular adenopathy present.       Left: No supraclavicular adenopathy present.  Neurological: He is alert and oriented to person, place, and time. He has normal strength. No cranial nerve deficit or sensory deficit. Coordination and gait normal.  Reflex Scores:       Bicep reflexes are 2+ on the right side and 2+ on the left side.      Patellar reflexes are 2+ on the right side and 2+ on the left side.      Achilles reflexes are 2+ on the right side and 2+ on the left side. Skin: Skin is warm, dry and intact. No rash noted. No cyanosis or erythema. Nails show no clubbing.  Psychiatric: His speech is normal and behavior is normal. Judgment and thought content normal. His mood appears not anxious. His affect is blunt. His affect is not angry, not labile and not inappropriate. Cognition and memory are normal. He does not exhibit a depressed mood.           Assessment & Plan:   1. Type 2 diabetes mellitus without complication, without long-term current use of insulin (HCC) Continue metformin. Refer to diabetes education. Add Invokana. Reassess diabetes in 12 weeks. - glucose blood test strip; Use as instructed  Dispense: 100 each; Refill: 12 - Ambulatory referral to diabetic education - canagliflozin (INVOKANA) 100 MG TABS tablet; Take 1 tablet (100 mg total) by mouth daily before breakfast.  Dispense: 30 tablet; Refill: 3 - metFORMIN (GLUCOPHAGE) 500 MG tablet; Take 2 tablets (1,000 mg total) by mouth 2 (two) times daily with a meal.  Dispense: 120 tablet; Refill: 3  2. Right sided abdominal pain Proceed with GI visit next week as planned, RTC if pain worsens.  3. 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 Doubt temporal arteritis. CT scan today to rule out intracratial abnormality (mass, bleed). Refer to neurology. If CT scan negative, would start topiramate. - CT Head Wo Contrast; Future - Ambulatory referral to Neurology    Discussed with Dr. Everlene Farrier, who also examined the patient.    Fara Chute, PA-C Physician Assistant-Certified [facility]

## 2015-09-05 NOTE — Telephone Encounter (Signed)
Call report: Head CT reveals no acute intracranial abnormality.  Meds ordered this encounter  Medications  . topiramate (TOPAMAX) 50 MG tablet    Sig: Take 1 tablet (50 mg total) by mouth at bedtime.    Dispense:  30 tablet    Refill:  1    Order Specific Question:  Supervising Provider    Answer:  Leandrew Koyanagi [id number]   See other phone message/result note regarding call to patient.  Follow-up with neurology as planned.

## 2015-09-11 ENCOUNTER — Ambulatory Visit (INDEPENDENT_AMBULATORY_CARE_PROVIDER_SITE_OTHER): Payer: 59 | Admitting: Gastroenterology

## 2015-09-11 ENCOUNTER — Encounter: Payer: Self-pay | Admitting: Gastroenterology

## 2015-09-11 VITALS — BP 114/60 | HR 72 | Ht 72.0 in | Wt 222.0 lb

## 2015-09-11 DIAGNOSIS — R1084 Generalized abdominal pain: Secondary | ICD-10-CM | POA: Diagnosis not present

## 2015-09-11 DIAGNOSIS — K859 Acute pancreatitis without necrosis or infection, unspecified: Secondary | ICD-10-CM

## 2015-09-11 NOTE — Patient Instructions (Signed)
  You have been scheduled for a CT scan of the abdomen and pelvis at [facility] ([address]---this is in the same building as [facility]).   You are scheduled on Monday 10-06-2015.  You should arrive at 8:45 am to your appointment time for registration. Please follow the written instructions below on the day of your exam:  WARNING: IF YOU ARE ALLERGIC TO IODINE/X-RAY DYE, PLEASE NOTIFY RADIOLOGY IMMEDIATELY AT [phone]! YOU WILL BE GIVEN A 13 HOUR PREMEDICATION PREP.  1) Do not eat  anything after 7:30 am (4 hours prior to your test) You will be given water to drink once you arrive.   You may take any medications as prescribed with a small amount of water except for the following: Metformin, Glucophage, Glucovance, Avandamet, Riomet, Fortamet, Actoplus Met, Janumet, Glumetza or Metaglip. The above medications must be held the day of the exam AND 48 hours after the exam.  The purpose of you drinking the oral contrast is to aid in the visualization of your intestinal tract. The contrast solution may cause some diarrhea. Before your exam is started, you will be given a small amount of fluid to drink. Depending on your individual set of symptoms, you may also receive an intravenous injection of x-ray contrast/dye. Plan on being at [facility] for 30 minutes or long, depending on the type of exam you are having performed.  If you have any questions regarding your exam or if you need to reschedule, you may call the CT department at [phone] between the hours of 8:00 am and 5:00 pm, Monday-Friday.  ________________________________________________________________________

## 2015-09-11 NOTE — Progress Notes (Addendum)
     09/11/2015 MASSAI MANDUJANO [id number] 06-Jul-1965   History of Present Illness:  This is a 50 year old with history of hypertension, sleep apnea, and newly diagnosed DM who is here for hospital follow-up.  He presented with right middle and lower quadrant abdominal pain felt secondary to pancreatitis after CT, MRI with MRCP, and upper endoscopy.  Also had fever.  Precipitant for pancreatitis is unclear as he drinks alcohol rarely and there is been no evidence of gallstones or CBD obstruction. Interestingly lipase was normal during hospitalization (uncommon, but certainly not unheard of).  Here for follow-up.  Symptoms significantly improved but still has some pain.  Lipase actually slightly elevated when checked by PCP a couple of weeks ago.  No further fevers.  No nausea or vomiting.  Moving bowels fairly well.  Eating well.  Is trying to follow low fat diet.  Plan was to repeat CT scan with pancreatic protocol in several weeks.   Current Medications, Allergies, Past Medical History, Past Surgical History, Family History and Social History were reviewed in [employer] record.   Physical Exam: BP 114/60 mmHg  Pulse 72  Ht 6' (1.829 m)  Wt 222 lb (100.699 kg)  BMI 30.10 kg/m2 General: Well developed white male in no acute distress Head: Normocephalic and atraumatic Eyes:  Sclerae anicteric, conjunctiva pink  Ears: Normal auditory acuity Lungs: Clear throughout to auscultation Heart: Regular rate and rhythm Abdomen: Soft, non-distended.  Normal bowel sounds.  Mild right mid and lower abdominal TTP. Musculoskeletal: Symmetrical with no gross deformities  Extremities: No edema  Neurological: Alert oriented x 4, grossly non-focal Psychological:  Alert and cooperative. Normal mood and affect  Assessment and Recommendations: *50 year old with history of hypertension, sleep apnea, and newly diagnosed DM who presented with right middle and lower quadrant abdominal pain  felt secondary to pancreatitis after CT, MRI with MRCP, and upper endoscopy  1. Right middle and lower abd pain/fever -- no evidence for duodenitis on EGD and after MRI/MRCP images reviewed symptoms felt secondary to pancreatitis originating in the pancreatic head. This is very near the duodenal C-loop and there is inflammatory fluid in this area. Precipitant for pancreatitis is unclear as he drinks alcohol rarely and there is been no evidence of gallstones or CBD obstruction. Interestingly lipase was normal during hospitalization (uncommon, but certainly not unheard of).  Here for follow-up.  Symptoms significantly improved but still has some pain.  Lipase actually slightly elevated when checked by PCP a couple of weeks ago. --Advised that pain may take several weeks to completely resolve. --Continue on low-fat diet. --Repeat CT pancreas protocol recommended in 6-8 weeks to ensure resolution of inflammation and exclude underlying pancreatic head pathology, which we will arrange while he is here today.  Addendum: Reviewed and agree with management. Followup repeat pancreas protocol CT [name], MD

## 2015-09-12 ENCOUNTER — Other Ambulatory Visit: Payer: Self-pay

## 2015-09-12 MED ORDER — GLUCOSE BLOOD VI STRP: ORAL_STRIP | Status: AC

## 2015-09-15 ENCOUNTER — Ambulatory Visit: Payer: 59 | Admitting: Neurology

## 2015-09-15 ENCOUNTER — Telehealth: Payer: Self-pay | Admitting: *Deleted

## 2015-09-15 NOTE — Telephone Encounter (Signed)
no showed NP appt 

## 2015-09-16 ENCOUNTER — Encounter: Payer: Self-pay | Admitting: Neurology

## 2015-09-26 DIAGNOSIS — Z0271 Encounter for disability determination: Secondary | ICD-10-CM

## 2015-10-06 ENCOUNTER — Ambulatory Visit (INDEPENDENT_AMBULATORY_CARE_PROVIDER_SITE_OTHER)
Admission: RE | Admit: 2015-10-06 | Discharge: 2015-10-06 | Disposition: A | Discharge status: Home / Self Care | Payer: 59 | Source: Ambulatory Visit | Attending: Gastroenterology | Admitting: Gastroenterology

## 2015-10-06 DIAGNOSIS — K859 Acute pancreatitis without necrosis or infection, unspecified: Secondary | ICD-10-CM | POA: Diagnosis not present

## 2015-10-06 MED ORDER — IOPAMIDOL (ISOVUE-300) INJECTION 61%
100.0000 mL | Freq: Once | INTRAVENOUS | Status: AC | PRN
Start: 2015-10-06 — End: 2015-10-06
  Administered 2015-10-06: 100 mL via INTRAVENOUS

## 2015-11-06 ENCOUNTER — Encounter: Payer: 59 | Attending: Internal Medicine | Admitting: *Deleted

## 2015-11-06 ENCOUNTER — Encounter: Payer: Self-pay | Admitting: *Deleted

## 2015-11-06 DIAGNOSIS — Z713 Dietary counseling and surveillance: Secondary | ICD-10-CM | POA: Diagnosis not present

## 2015-11-06 DIAGNOSIS — E119 Type 2 diabetes mellitus without complications: Secondary | ICD-10-CM | POA: Diagnosis not present

## 2015-11-06 NOTE — Progress Notes (Signed)
Diabetes Self-Management Education  Visit Type: First/Initial  Appt. Start Time: 0730 Appt. End Time: 0845  11/06/2015  [name], identified by name and date of birth, is a 50 y.o. male with a diagnosis of Diabetes: Type 2.   ASSESSMENT  Weight 216 lb (98 kg). Body mass index is 29.29 kg/m.      Diabetes Self-Management Education - 11/06/15 0739      Visit Information   Visit Type First/Initial     Initial Visit   Diabetes Type Type 2   Are you currently following a meal plan? No   Are you taking your medications as prescribed? Yes   Date Diagnosed 08/2015     Health Coping   How would you rate your overall health? Fair     Psychosocial Assessment   Patient Belief/Attitude about Diabetes Motivated to manage diabetes   Self-care barriers None   Self-management support Friends;Family   Other persons present Patient   Patient Concerns Nutrition/Meal planning   Special Needs None   Learning Readiness Change in progress   What is the last grade level you completed in school? some college     Pre-Education Assessment   Patient understands the diabetes disease and treatment process. Needs Instruction   Patient understands incorporating nutritional management into lifestyle. Needs Instruction   Patient undertands incorporating physical activity into lifestyle. Needs Instruction   Patient understands using medications safely. Needs Instruction   Patient understands monitoring blood glucose, interpreting and using results Needs Instruction   Patient understands prevention, detection, and treatment of acute complications. Needs Instruction   Patient understands prevention, detection, and treatment of chronic complications. Needs Instruction   Patient understands how to develop strategies to address psychosocial issues. Needs Instruction   Patient understands how to develop strategies to promote health/change behavior. Needs Instruction     Complications   Last HgB A1C  per patient/outside source 9.8 %   How often do you check your blood sugar? 1-2 times/day   Fasting Blood glucose range (mg/dL) 70-129;130-179   Number of hypoglycemic episodes per month 0   Have you had a dilated eye exam in the past 12 months? Yes   Have you had a dental exam in the past 12 months? Yes   Are you checking your feet? Yes   How many days per week are you checking your feet? 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 5   How many minutes per day do you exercise? 30   Total minutes per week of exercise 150     Patient Education   Previous Diabetes Education No   Disease state  Definition of diabetes, type 1 and 2, and the diagnosis of diabetes   Nutrition management  Role of diet in the treatment of diabetes and the relationship between the three main macronutrients and blood glucose  level;Food label reading, portion sizes and measuring food.;Carbohydrate counting   Physical activity and exercise  Role of exercise on diabetes management, blood pressure control and cardiac health.   Medications Reviewed patients medication for diabetes, action, purpose, timing of dose and side effects.   Monitoring Purpose and frequency of SMBG.;Identified appropriate SMBG and/or A1C goals.   Chronic complications Relationship between chronic complications and blood glucose control     Individualized  Goals (developed by patient)   Nutrition Follow meal plan discussed   Physical Activity Exercise 3-5 times per week   Medications take my medication as prescribed   Monitoring  test blood glucose pre and post meals as discussed     Post-Education Assessment   Patient understands the diabetes disease and treatment process. Demonstrates understanding / competency   Patient understands incorporating nutritional management into lifestyle. Demonstrates understanding / competency   Patient undertands incorporating physical activity into lifestyle. Demonstrates understanding / competency   Patient understands using medications safely. Demonstrates understanding / competency   Patient understands monitoring blood glucose, interpreting and using results Demonstrates understanding / competency   Patient understands prevention, detection, and treatment of chronic complications. Demonstrates understanding / competency     Outcomes   Expected Outcomes Demonstrated interest in learning. Expect positive outcomes   Future DMSE PRN   Program Status Completed      Individualized Plan for Diabetes Self-Management Training:   Learning Objective:  Patient will have a greater understanding of diabetes self-management. Patient education plan is to attend individual and/or group sessions per assessed needs and concerns.   Plan:   Patient Instructions  Plan:  Aim for 4 Carb Choices per meal (60 grams) +/- 1 either way  Aim for 0-2 Carbs per snack if hungry  Include protein in moderation with your meals and snacks Continue reading food labels for Total Carbohydrate of foods Continue with your activity level daily as tolerated Consider checking BG at alternate times per day on occasion Continue taking medication as directed by MD      Expected Outcomes:  Demonstrated interest in learning. Expect positive outcomes  Education material provided: Living Well with Diabetes, A1C conversion sheet, Meal  plan card and Carbohydrate counting sheet  If problems or questions, patient to contact team via:  Phone and Email  Future DSME appointment: PRN

## 2015-11-06 NOTE — Patient Instructions (Signed)
Plan:  Aim for 4 Carb Choices per meal (60 grams) +/- 1 either way  Aim for 0-2 Carbs per snack if hungry  Include protein in moderation with your meals and snacks Continue reading food labels for Total Carbohydrate of foods Continue with your activity level daily as tolerated Consider checking BG at alternate times per day on occasion Continue taking medication as directed by MD

## 2015-12-21 ENCOUNTER — Other Ambulatory Visit: Payer: Self-pay | Admitting: Physician Assistant

## 2015-12-21 DIAGNOSIS — E119 Type 2 diabetes mellitus without complications: Secondary | ICD-10-CM

## 2016-01-05 ENCOUNTER — Other Ambulatory Visit: Payer: Self-pay

## 2016-01-05 DIAGNOSIS — K760 Fatty (change of) liver, not elsewhere classified: Secondary | ICD-10-CM

## 2016-01-05 DIAGNOSIS — N200 Calculus of kidney: Secondary | ICD-10-CM

## 2016-01-10 ENCOUNTER — Other Ambulatory Visit: Payer: Self-pay | Admitting: Physician Assistant

## 2016-01-10 DIAGNOSIS — E119 Type 2 diabetes mellitus without complications: Secondary | ICD-10-CM

## 2016-01-12 NOTE — Telephone Encounter (Signed)
08/27/2015 last ov and labs for DM

## 2016-01-12 NOTE — Telephone Encounter (Signed)
Meds ordered this encounter  Medications  . metFORMIN (GLUCOPHAGE) 500 MG tablet    Sig: TAKE 2 TABLETS BY MOUTH TWICE A DAY WITH A MEAL    Dispense:  120 tablet    Refill:  0    Please advise patient that he is overdue for re-evaluation.

## 2016-02-03 ENCOUNTER — Other Ambulatory Visit: Payer: Self-pay | Admitting: Physician Assistant

## 2016-02-03 DIAGNOSIS — E119 Type 2 diabetes mellitus without complications: Secondary | ICD-10-CM

## 2016-02-11 ENCOUNTER — Other Ambulatory Visit: Payer: Self-pay | Admitting: Physician Assistant

## 2016-02-11 DIAGNOSIS — E119 Type 2 diabetes mellitus without complications: Secondary | ICD-10-CM

## 2016-03-18 ENCOUNTER — Other Ambulatory Visit: Payer: Self-pay | Admitting: Physician Assistant

## 2016-03-18 DIAGNOSIS — E119 Type 2 diabetes mellitus without complications: Secondary | ICD-10-CM

## 2016-05-13 ENCOUNTER — Other Ambulatory Visit: Payer: Self-pay | Admitting: Orthopaedic Surgery

## 2016-05-13 DIAGNOSIS — M4726 Other spondylosis with radiculopathy, lumbar region: Secondary | ICD-10-CM

## 2016-05-19 ENCOUNTER — Ambulatory Visit
Admission: RE | Admit: 2016-05-19 | Discharge: 2016-05-19 | Disposition: A | Discharge status: Home / Self Care | Payer: Self-pay | Source: Ambulatory Visit | Attending: Orthopaedic Surgery | Admitting: Orthopaedic Surgery

## 2016-05-19 DIAGNOSIS — M4726 Other spondylosis with radiculopathy, lumbar region: Secondary | ICD-10-CM

## 2016-05-20 ENCOUNTER — Telehealth: Payer: Self-pay

## 2016-05-20 NOTE — Telephone Encounter (Signed)
Dr [name], I contacted pt about this. He does not wish to have the CT scan and he feels that it is not necessary. He states that he is still paying on his last scan. Also, he sees Dr Reece Levy?? And feels that he is "on top of all this".

## 2016-05-20 NOTE — Telephone Encounter (Signed)
-----   Message from Larina Bras, Aldan sent at 05/20/2016  3:20 PM EST ----- Regarding: RE: CT Order There is no documentation that I can see where he was ever contacted to actually schedule the September CT so we dont really have anything to stand on if anything is abnormal. But, yes, I would say he does need follow up scan. ----- Message ----- From: Alphonzo Dublin, LPN Sent: [id number]   2:18 PM To: Larina Bras, CMA Subject: CT Order                                       Lorriane Shire came to me earlier asking me about a CT scan for this pt. There looks like there was an order put in by me but pt never had it done. Im not sure if pt cancelled or what. Pt was supposed to have f/u scan 12/2015 per [facility]. Do I need to call pt and have him scanned?

## 2016-05-21 NOTE — Telephone Encounter (Signed)
We have recommended repeat CT scan for follow-up of history of pancreatitis Patient deferring Prefers to follow-up with primary care We will remain available as needed

## 2016-06-18 DIAGNOSIS — M519 Unspecified thoracic, thoracolumbar and lumbosacral intervertebral disc disorder: Secondary | ICD-10-CM | POA: Insufficient documentation

## 2016-06-18 DIAGNOSIS — M48062 Spinal stenosis, lumbar region with neurogenic claudication: Secondary | ICD-10-CM | POA: Insufficient documentation

## 2016-07-08 ENCOUNTER — Other Ambulatory Visit: Payer: Self-pay | Admitting: Physician Assistant

## 2016-07-08 NOTE — Telephone Encounter (Signed)
Pt needs OV.  He got this Rx last in 08/2015 with a 2 month supply.  He must not be taking regularly.

## 2016-11-22 ENCOUNTER — Encounter: Payer: Self-pay | Admitting: Internal Medicine

## 2017-01-25 ENCOUNTER — Encounter: Payer: Self-pay | Admitting: Internal Medicine

## 2017-02-07 IMAGING — CT CT HEAD W/O CM
1 series · 16 of 30 positions shown, 20 images · non-contrast
Comparison: 01/03/2006

CLINICAL DATA: Acute right sided headache, starts around right eye,
occurred about [DATE] waned off and starts again at [DATE] every
night x2 weeks, associated photophobia. This has been occurring
since patient started metformin for new onset diabetes.

EXAM:
CT HEAD WITHOUT CONTRAST
TECHNIQUE: Contiguous axial images were obtained from the base of the skull
through the vertex without intravenous contrast.

[Series 2: headseq 4.8 h45s · axial · 0.45mm/px · z∈[+1169,+1353]mm · 16 of 42 slices shown, 20 images]
[im 2/42  brain]
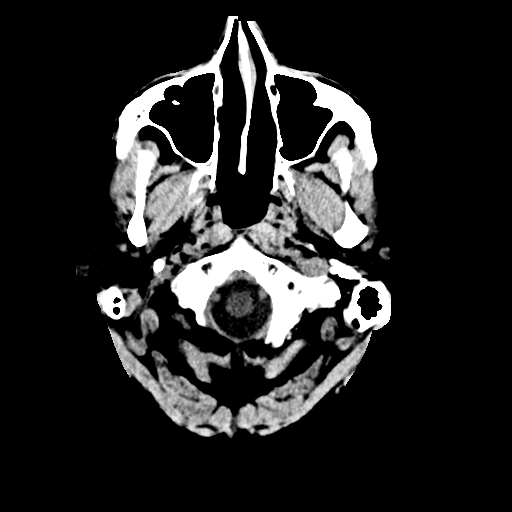
[im 2/42  bone]
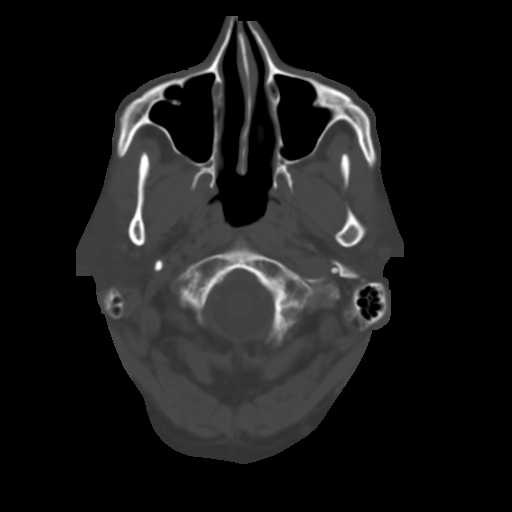
[im 5/42  brain]
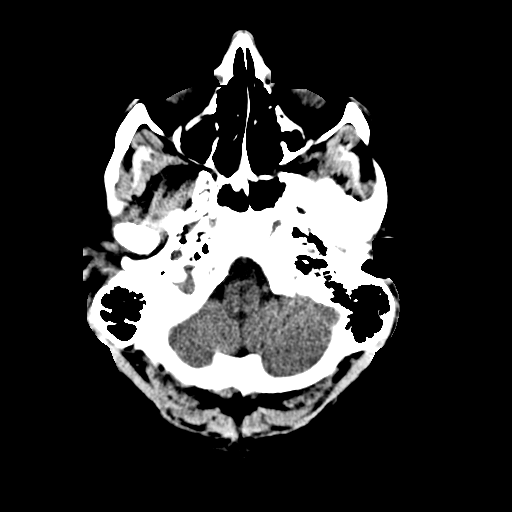
[im 8/42  brain]
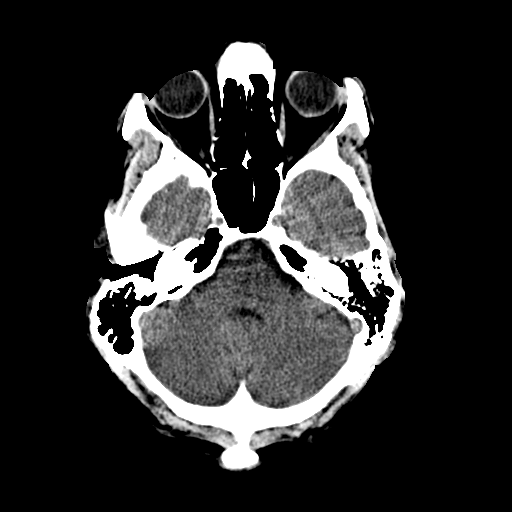
[im 10/42  brain]
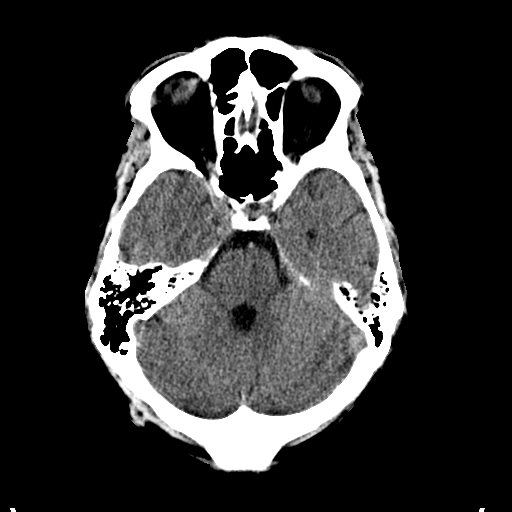
[im 12/42  brain]
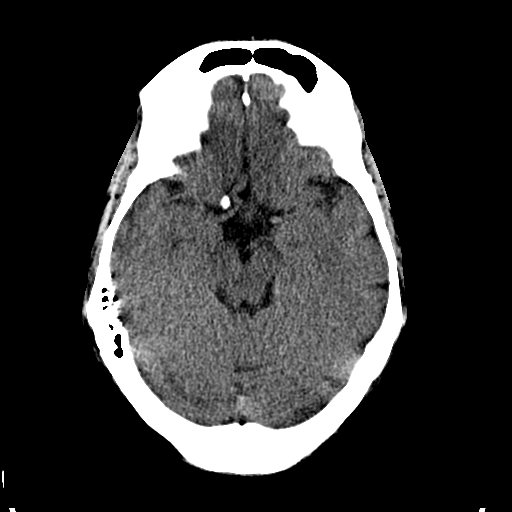
[im 12/42  bone]
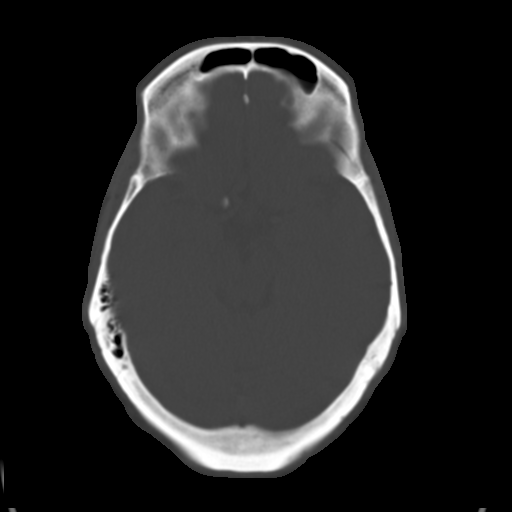
[im 15/42  brain]
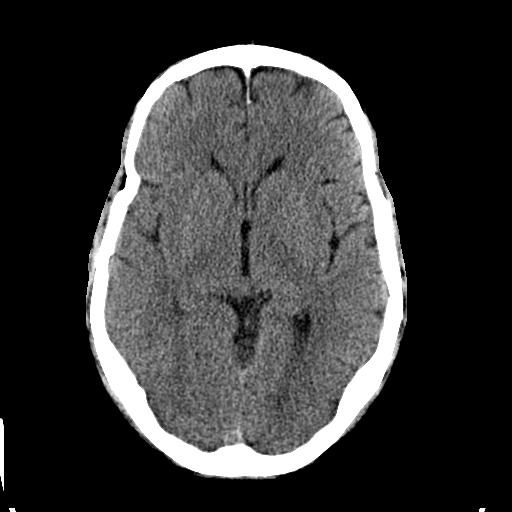
[im 17/42  brain]
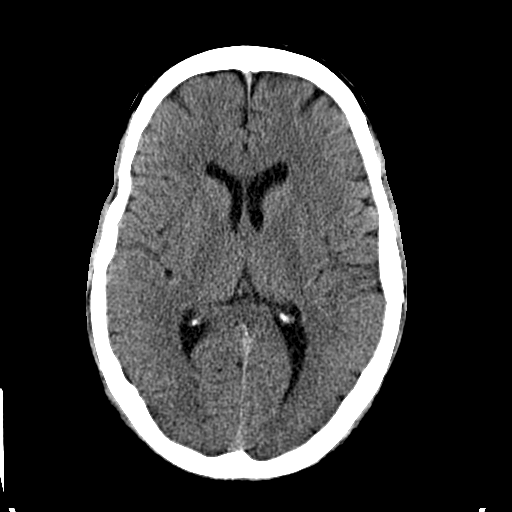
[im 20/42  brain]
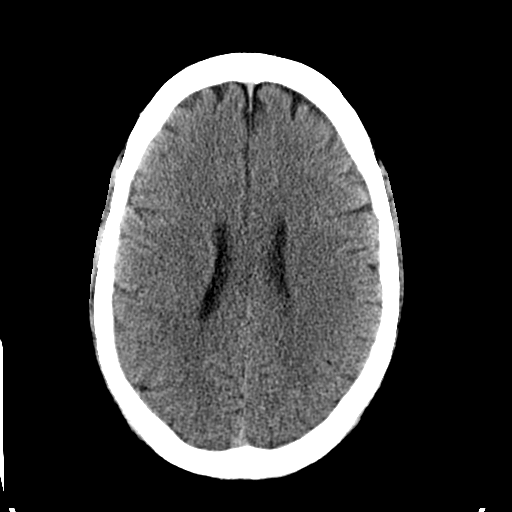
[im 22/42  brain]
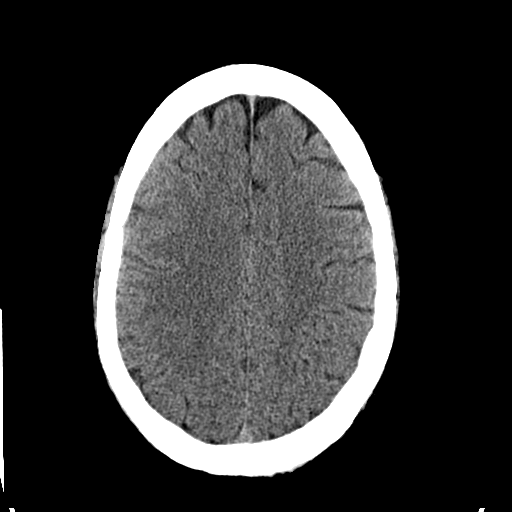
[im 22/42  bone]
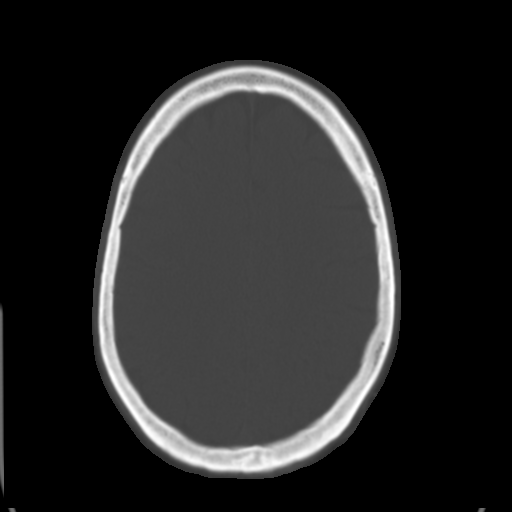
[im 25/42  brain]
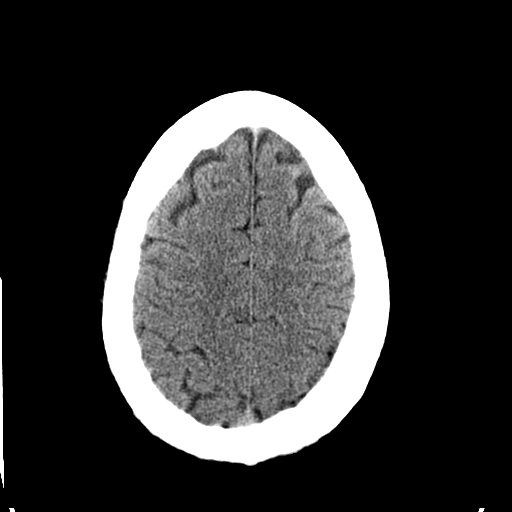
[im 27/42  brain]
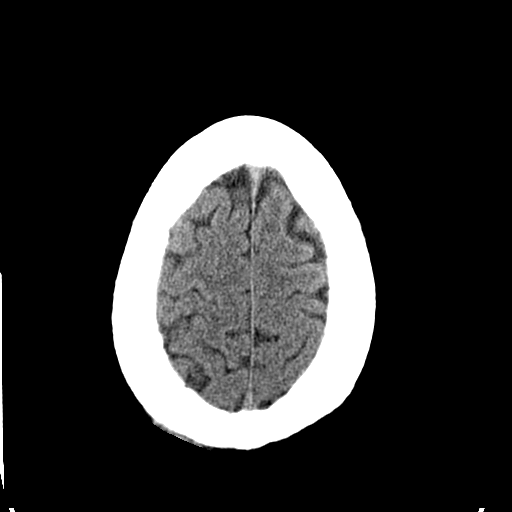
[im 30/42  brain]
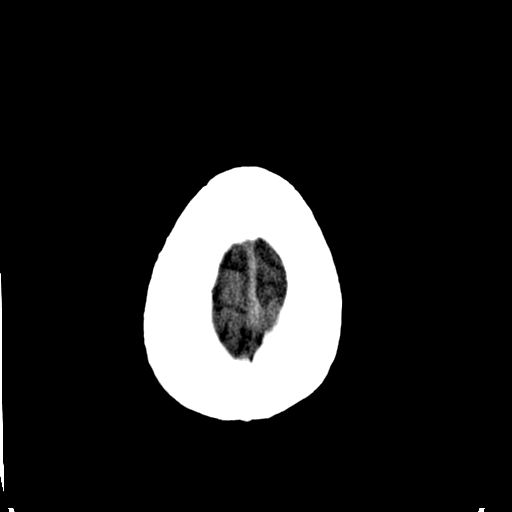
[im 32/42  brain]
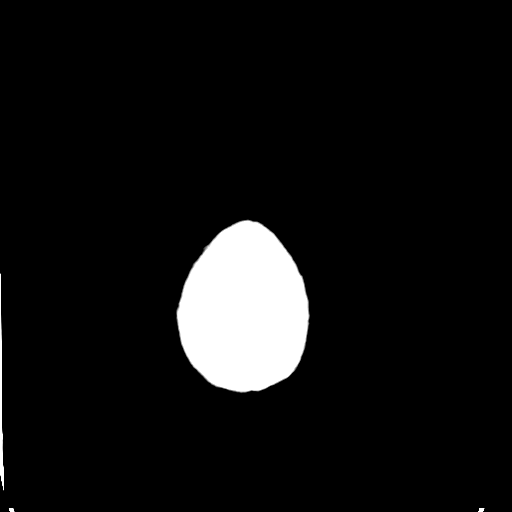
[im 32/42  bone]
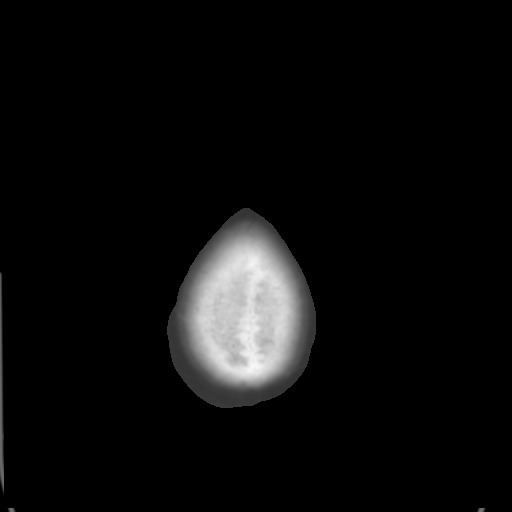
[im 34/42  brain]
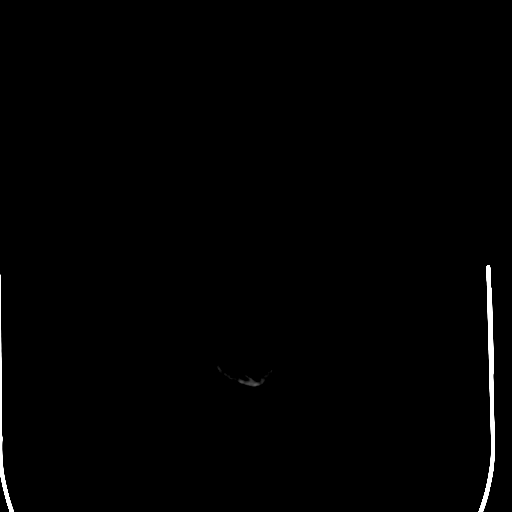
[im 37/42  brain]
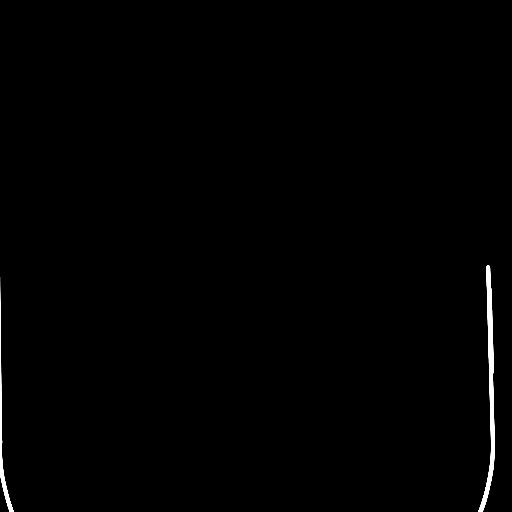
[im 40/42  brain]
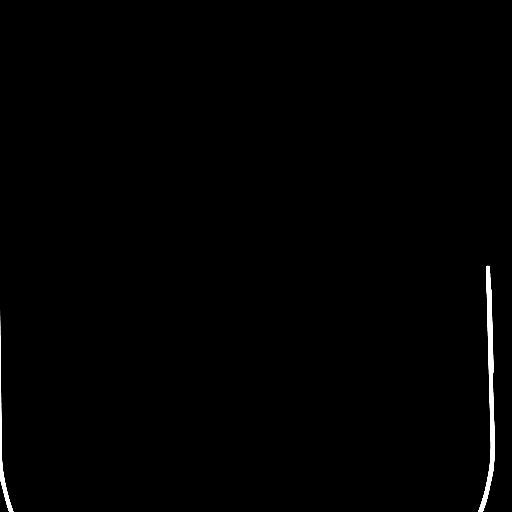

[16 of 30 positions shown; findings below may reference images not displayed]

FINDINGS: There is no evidence of mass effect, midline shift or extra-axial
fluid collections. There is no evidence of a space-occupying lesion
or intracranial hemorrhage. There is no evidence of a cortical-based
area of acute infarction.

The ventricles and sulci are appropriate for the patient's age. The
basal cisterns are patent.

Visualized portions of the orbits are unremarkable. The visualized
portions of the paranasal sinuses and mastoid air cells are
unremarkable.

The osseous structures are unremarkable.
IMPRESSION: No acute intracranial pathology.

## 2017-02-09 ENCOUNTER — Encounter: Payer: BLUE CROSS/BLUE SHIELD | Admitting: Internal Medicine

## 2018-02-13 ENCOUNTER — Encounter: Payer: Self-pay | Admitting: Podiatry

## 2018-02-13 ENCOUNTER — Ambulatory Visit: Payer: Managed Care, Other (non HMO) | Admitting: Podiatry

## 2018-02-13 DIAGNOSIS — E1149 Type 2 diabetes mellitus with other diabetic neurological complication: Secondary | ICD-10-CM

## 2018-02-13 MED ORDER — GABAPENTIN 300 MG PO CAPS: ORAL_CAPSULE | ORAL | 3 refills | Status: DC

## 2018-02-13 NOTE — Patient Instructions (Signed)
Gabapentin capsules or tablets What is this medicine? GABAPENTIN (GA ba pen tin) is used to control partial seizures in adults with epilepsy. It is also used to treat certain types of nerve pain. This medicine may be used for other purposes; ask your health care provider or pharmacist if you have questions. COMMON BRAND NAME(S): Active-PAC with Gabapentin, Gabarone, Neurontin What should I tell my health care provider before I take this medicine? They need to know if you have any of these conditions: -kidney disease -suicidal thoughts, plans, or attempt; a previous suicide attempt by you or a family member -an unusual or allergic reaction to gabapentin, other medicines, foods, dyes, or preservatives -pregnant or trying to get pregnant -breast-feeding How should I use this medicine? Take this medicine by mouth with a glass of water. Follow the directions on the prescription label. You can take it with or without food. If it upsets your stomach, take it with food.Take your medicine at regular intervals. Do not take it more often than directed. Do not stop taking except on your doctor's advice. If you are directed to break the 600 or 800 mg tablets in half as part of your dose, the extra half tablet should be used for the next dose. If you have not used the extra half tablet within 28 days, it should be thrown away. A special MedGuide will be given to you by the pharmacist with each prescription and refill. Be sure to read this information carefully each time. Talk to your pediatrician regarding the use of this medicine in children. Special care may be needed. Overdosage: If you think you have taken too much of this medicine contact a poison control center or emergency room at once. NOTE: This medicine is only for you. Do not share this medicine with others. What if I miss a dose? If you miss a dose, take it as soon as you can. If it is almost time for your next dose, take only that dose. Do not  take double or extra doses. What may interact with this medicine? Do not take this medicine with any of the following medications: -other gabapentin products This medicine may also interact with the following medications: -alcohol -antacids -antihistamines for allergy, cough and cold -certain medicines for anxiety or sleep -certain medicines for depression or psychotic disturbances -homatropine; hydrocodone -naproxen -narcotic medicines (opiates) for pain -phenothiazines like chlorpromazine, mesoridazine, prochlorperazine, thioridazine This list may not describe all possible interactions. Give your health care provider a list of all the medicines, herbs, non-prescription drugs, or dietary supplements you use. Also tell them if you smoke, drink alcohol, or use illegal drugs. Some items may interact with your medicine. What should I watch for while using this medicine? Visit your doctor or health care professional for regular checks on your progress. You may want to keep a record at home of how you feel your condition is responding to treatment. You may want to share this information with your doctor or health care professional at each visit. You should contact your doctor or health care professional if your seizures get worse or if you have any new types of seizures. Do not stop taking this medicine or any of your seizure medicines unless instructed by your doctor or health care professional. Stopping your medicine suddenly can increase your seizures or their severity. Wear a medical identification bracelet or chain if you are taking this medicine for seizures, and carry a card that lists all your medications. You may get drowsy, dizzy,   or have blurred vision. Do not drive, use machinery, or do anything that needs mental alertness until you know how this medicine affects you. To reduce dizzy or fainting spells, do not sit or stand up quickly, especially if you are an older patient. Alcohol can  increase drowsiness and dizziness. Avoid alcoholic drinks. Your mouth may get dry. Chewing sugarless gum or sucking hard candy, and drinking plenty of water will help. The use of this medicine may increase the chance of suicidal thoughts or actions. Pay special attention to how you are responding while on this medicine. Any worsening of mood, or thoughts of suicide or dying should be reported to your health care professional right away. Women who become pregnant while using this medicine may enroll in the [place] Antiepileptic Drug Pregnancy Registry by calling 1-888-233-2334. This registry collects information about the safety of antiepileptic drug use during pregnancy. What side effects may I notice from receiving this medicine? Side effects that you should report to your doctor or health care professional as soon as possible: -allergic reactions like skin rash, itching or hives, swelling of the face, lips, or tongue -worsening of mood, thoughts or actions of suicide or dying Side effects that usually do not require medical attention (report to your doctor or health care professional if they continue or are bothersome): -constipation -difficulty walking or controlling muscle movements -dizziness -nausea -slurred speech -tiredness -tremors -weight gain This list may not describe all possible side effects. Call your doctor for medical advice about side effects. You may report side effects to FDA at 1-800-FDA-1088. Where should I keep my medicine? Keep out of reach of children. This medicine may cause accidental overdose and death if it taken by other adults, children, or pets. Mix any unused medicine with a substance like cat litter or coffee grounds. Then throw the medicine away in a sealed container like a sealed bag or a coffee can with a lid. Do not use the medicine after the expiration date. Store at room temperature between 15 and 30 degrees C (59 and 86 degrees F). NOTE: This  sheet is a summary. It may not cover all possible information. If you have questions about this medicine, talk to your doctor, pharmacist, or health care provider.  2018 Elsevier/Gold Standard (2013-05-25 15:26:50)  

## 2018-02-15 NOTE — Progress Notes (Signed)
Subjective:   Patient ID: Steve Ritter, male   DOB: 52 y.o.   MRN: [id number]   HPI 52 year old male presents the office today for concerns of pins-and-needles sensations to both of his feet.  He recently started gabapentin 300 mg 3 times a day.  He states that he first started taking it it was helping but then and no longer was helpful.  He denies any recent injury or trauma to his feet denies any ulceration or claudication symptoms.  No swelling.  He has no other concerns.  He is diabetic and last A1c was 9.6.  Last time he checked his blood sugar was on Saturday and he reports it to be 145.   Review of Systems  All other systems reviewed and are negative.  Past Medical History:  Diagnosis Date  . Bloody diarrhea 06/2011   Left sided colitis per CT. Colonoscopy with segmental colitis at 50 to 25 cm, propably ischemic.   Marland Kitchen Fatty liver 08/2015  . Hypertension   . Obesity 08/2015   BMI 32.   Marland Kitchen OSA (obstructive sleep apnea) 2015   dx per nerologist in [place].   Marland Kitchen Peyronie disease     Past Surgical History:  Procedure Laterality Date  . BILATERAL CARPAL TUNNEL RELEASE Bilateral   . COLONOSCOPY  06/2011   [name], [place].  acute colitis at 50 to 25 cm, propably ischemic.  removed small rectal polyp (hyperplastic on path)   . ESOPHAGOGASTRODUODENOSCOPY N/A 08/20/2015   Procedure: ESOPHAGOGASTRODUODENOSCOPY (EGD);  Surgeon: [name], MD;  Location: [facility];  Service: Endoscopy;  Laterality: N/A;     Current Outpatient Medications:  .  methocarbamol (ROBAXIN) 750 MG tablet, Take by mouth., Disp: , Rfl:  .  naproxen (NAPROSYN) 500 MG tablet, Take by mouth., Disp: , Rfl:  .  ALPRAZolam (XANAX) 0.5 MG tablet, Take 1 tablet by mouth as needed. anxiety, Disp: , Rfl: 1 .  amLODipine (NORVASC) 5 MG tablet, Take 5 mg by mouth daily., Disp: , Rfl: 3 .  aspirin EC 81 MG tablet, Take 81 mg by mouth., Disp: , Rfl:  .  blood glucose meter kit and supplies KIT, Dispense  based on patient and insurance preference. Use up to four times daily as directed. (FOR ICD-9 250.00, 250.01)., Disp: 1 each, Rfl: 0 .  cyclobenzaprine (FLEXERIL) 5 MG tablet, Take 5 mg by mouth 3 (three) times daily., Disp: , Rfl: 0 .  fenofibrate (TRICOR) 145 MG tablet, Take 145 mg by mouth at bedtime., Disp: , Rfl: 1 .  gabapentin (NEURONTIN) 300 MG capsule, Take 300 mg by mouth at bedtime., Disp: , Rfl: 3 .  gabapentin (NEURONTIN) 300 MG capsule, Take 1 pill in the morning and lunch time then take 2 at night, Disp: 90 capsule, Rfl: 3 .  glucose blood test strip, Use to test blood sugar up to 4 times daily., Disp: 400 each, Rfl: 3 .  INVOKANA 100 MG TABS tablet, TAKE 1 TABLET BY MOUTH EVERY DAY BEFORE BREAKFAST, Disp: 30 tablet, Rfl: 0 .  JARDIANCE 25 MG TABS tablet, Take 25 mg by mouth daily., Disp: , Rfl: 4 .  metFORMIN (GLUCOPHAGE) 500 MG tablet, TAKE 2 TABLETS BY MOUTH TWICE A DAY WITH A MEAL, Disp: 60 tablet, Rfl: 0 .  omeprazole (PRILOSEC) 40 MG capsule, Take 40 mg by mouth daily., Disp: , Rfl: 2 .  oxyCODONE (OXY IR/ROXICODONE) 5 MG immediate release tablet, Take 1-2 tablets (5-10 mg total) by mouth every 6 (six)  hours as needed for moderate pain., Disp: 30 tablet, Rfl: 0 .  polyethylene glycol (MIRALAX / GLYCOLAX) packet, Take 1 packet by mouth daily., Disp: , Rfl:  .  Polyethylene Glycol 3350 GRAN, Take by mouth., Disp: , Rfl:  .  predniSONE (STERAPRED UNI-PAK 21 TAB) 5 MG (21) TBPK tablet, See admin instructions. see package, Disp: , Rfl: 0 .  topiramate (TOPAMAX) 50 MG tablet, Take 1 tablet (50 mg total) by mouth at bedtime., Disp: 30 tablet, Rfl: 1 .  varenicline (CHANTIX CONTINUING MONTH PAK) 1 MG tablet, Take 1 tablet by mouth daily., Disp: , Rfl:  .  vitamin E 100 UNIT capsule, Take by mouth., Disp: , Rfl:   Allergies  Allergen Reactions  . Cephalexin Hives    Other reaction(s): ITCHING  . Hydroxyzine Hcl Hives    Other reaction(s): ITCHING  . Shellfish Allergy Anaphylaxis     Other reaction(s): DIFFICULTY BREATHING  . Codeine   . Erythromycin   . Penicillins          Objective:  Physical Exam  General: AAO x3, NAD  Dermatological: Skin is warm, dry and supple bilateral. Nails x 10 are well manicured; remaining integument appears unremarkable at this time. There are no open sores, no preulcerative lesions, no rash or signs of infection present.  Vascular: Dorsalis Pedis artery and Posterior Tibial artery pedal pulses are 2/4 bilateral with immedate capillary fill time.  There is no pain with calf compression, swelling, warmth, erythema.   Neruologic: Overall sensation appears to be mostly intact with Derrel Nip monofilament and vibratory sensation intact.  He describing pins and needle sensations to his feet.  Musculoskeletal: No gross boney pedal deformities bilateral. No pain, crepitus, or limitation noted with foot and ankle range of motion bilateral. Muscular strength 5/5 in all groups tested bilateral.  Gait: Unassisted, Nonantalgic.       Assessment:   Type 2 diabetes with neuropathy     Plan:  -Treatment options discussed including all alternatives, risks, and complications -Etiology of symptoms were discussed -We discussed her treatment options.  We discussed changing medication to Lyrica but were to increase the dose of gabapentin to get to where he is taking 600 mg.  Discussed gradually titrating up and is a start with 600 mg at nighttime.  In the next week or 2 was in a call me as needed we can go to 600 mg more frequently.  If no improvement will likely switch to Lyrica.  Also try to get the blood work from his primary care physician's office to check his kidney function.  Trula Slade DPM

## 2018-02-17 ENCOUNTER — Telehealth: Payer: Self-pay | Admitting: *Deleted

## 2018-02-17 NOTE — Telephone Encounter (Signed)
-----   Message from Trula Slade, DPM sent at 02/17/2018  7:31 AM EST ----- Please let him know that I reviewed his blood work and his kidney function is normal. Will go up to 600mg  of gabapentin at night to see if this helps and if not we can increase to 600mg  the other 2 times he is taking it. Thanks.

## 2018-02-17 NOTE — Telephone Encounter (Signed)
I informed pt of Dr. Leigh Aurora review of results and orders. Pt states he has been taking 600mg  gabapentin 3 times a day since 02/13/2018 and his feet aren't tingling.

## 2018-03-02 ENCOUNTER — Other Ambulatory Visit: Payer: Self-pay | Admitting: Orthopaedic Surgery

## 2018-03-02 DIAGNOSIS — M5116 Intervertebral disc disorders with radiculopathy, lumbar region: Secondary | ICD-10-CM

## 2018-03-16 ENCOUNTER — Ambulatory Visit
Admission: RE | Admit: 2018-03-16 | Discharge: 2018-03-16 | Disposition: A | Discharge status: Home / Self Care | Payer: Managed Care, Other (non HMO) | Source: Ambulatory Visit | Attending: Orthopaedic Surgery | Admitting: Orthopaedic Surgery

## 2018-03-16 DIAGNOSIS — M5116 Intervertebral disc disorders with radiculopathy, lumbar region: Secondary | ICD-10-CM

## 2018-03-16 MED ORDER — GADOBENATE DIMEGLUMINE 529 MG/ML IV SOLN
20.0000 mL | Freq: Once | INTRAVENOUS | Status: AC | PRN
  Administered 2018-03-16: 20 mL via INTRAVENOUS

## 2018-04-26 DIAGNOSIS — M545 Low back pain: Secondary | ICD-10-CM | POA: Diagnosis not present

## 2018-04-26 DIAGNOSIS — M5136 Other intervertebral disc degeneration, lumbar region: Secondary | ICD-10-CM | POA: Diagnosis not present

## 2018-05-09 DIAGNOSIS — M5136 Other intervertebral disc degeneration, lumbar region: Secondary | ICD-10-CM | POA: Diagnosis not present

## 2018-05-22 ENCOUNTER — Ambulatory Visit: Payer: Managed Care, Other (non HMO) | Admitting: Podiatry

## 2018-05-29 DIAGNOSIS — M5136 Other intervertebral disc degeneration, lumbar region: Secondary | ICD-10-CM | POA: Diagnosis not present

## 2018-05-29 DIAGNOSIS — L603 Nail dystrophy: Secondary | ICD-10-CM | POA: Diagnosis not present

## 2018-05-29 DIAGNOSIS — M545 Low back pain: Secondary | ICD-10-CM | POA: Diagnosis not present

## 2018-05-29 DIAGNOSIS — E1140 Type 2 diabetes mellitus with diabetic neuropathy, unspecified: Secondary | ICD-10-CM | POA: Diagnosis not present

## 2018-05-29 DIAGNOSIS — I10 Essential (primary) hypertension: Secondary | ICD-10-CM | POA: Diagnosis not present

## 2018-05-29 DIAGNOSIS — E119 Type 2 diabetes mellitus without complications: Secondary | ICD-10-CM | POA: Diagnosis not present

## 2018-05-29 DIAGNOSIS — M5116 Intervertebral disc disorders with radiculopathy, lumbar region: Secondary | ICD-10-CM | POA: Diagnosis not present

## 2018-07-06 DIAGNOSIS — M5136 Other intervertebral disc degeneration, lumbar region: Secondary | ICD-10-CM | POA: Diagnosis not present

## 2018-07-06 DIAGNOSIS — M545 Low back pain: Secondary | ICD-10-CM | POA: Diagnosis not present

## 2018-07-06 DIAGNOSIS — M5416 Radiculopathy, lumbar region: Secondary | ICD-10-CM | POA: Diagnosis not present

## 2018-09-26 DIAGNOSIS — M778 Other enthesopathies, not elsewhere classified: Secondary | ICD-10-CM | POA: Insufficient documentation

## 2018-09-26 DIAGNOSIS — K5900 Constipation, unspecified: Secondary | ICD-10-CM | POA: Insufficient documentation

## 2018-09-26 DIAGNOSIS — Z720 Tobacco use: Secondary | ICD-10-CM | POA: Insufficient documentation

## 2018-09-26 DIAGNOSIS — B353 Tinea pedis: Secondary | ICD-10-CM | POA: Insufficient documentation

## 2020-07-24 DIAGNOSIS — F17200 Nicotine dependence, unspecified, uncomplicated: Secondary | ICD-10-CM | POA: Diagnosis not present

## 2020-07-24 DIAGNOSIS — E1169 Type 2 diabetes mellitus with other specified complication: Secondary | ICD-10-CM | POA: Diagnosis not present

## 2020-07-24 DIAGNOSIS — E785 Hyperlipidemia, unspecified: Secondary | ICD-10-CM | POA: Diagnosis not present

## 2020-07-24 DIAGNOSIS — Z7189 Other specified counseling: Secondary | ICD-10-CM | POA: Diagnosis not present

## 2020-08-26 ENCOUNTER — Encounter: Payer: Self-pay | Admitting: Podiatry

## 2020-08-26 ENCOUNTER — Other Ambulatory Visit: Payer: Self-pay

## 2020-08-26 ENCOUNTER — Ambulatory Visit (INDEPENDENT_AMBULATORY_CARE_PROVIDER_SITE_OTHER): Payer: BC Managed Care – PPO

## 2020-08-26 ENCOUNTER — Ambulatory Visit (INDEPENDENT_AMBULATORY_CARE_PROVIDER_SITE_OTHER): Payer: BC Managed Care – PPO | Admitting: Podiatry

## 2020-08-26 DIAGNOSIS — M79671 Pain in right foot: Secondary | ICD-10-CM

## 2020-08-26 DIAGNOSIS — E1149 Type 2 diabetes mellitus with other diabetic neurological complication: Secondary | ICD-10-CM

## 2020-08-26 DIAGNOSIS — M779 Enthesopathy, unspecified: Secondary | ICD-10-CM

## 2020-08-26 DIAGNOSIS — M7750 Other enthesopathy of unspecified foot: Secondary | ICD-10-CM | POA: Diagnosis not present

## 2020-08-26 DIAGNOSIS — M79672 Pain in left foot: Secondary | ICD-10-CM | POA: Diagnosis not present

## 2020-08-26 MED ORDER — PREGABALIN 75 MG PO CAPS: 75.0000 mg | ORAL_CAPSULE | Freq: Two times a day (BID) | ORAL | 1 refills | Status: DC

## 2020-08-26 MED ORDER — MELOXICAM 15 MG PO TABS: 15.0000 mg | ORAL_TABLET | Freq: Every day | ORAL | 0 refills | Status: DC

## 2020-08-26 NOTE — Patient Instructions (Signed)
Pregabalin capsules What is this medicine? PREGABALIN (pre GAB a lin) is used to treat nerve pain from diabetes, shingles, spinal cord injury, and fibromyalgia. It is also used to control seizures in epilepsy. This medicine may be used for other purposes; ask your health care provider or pharmacist if you have questions. COMMON BRAND NAME(S): Lyrica What should I tell my health care provider before I take this medicine? They need to know if you have any of these conditions:  drug abuse or addiction  heart failure  kidney disease  lung disease  suicidal thoughts, plans or attempt  an unusual or allergic reaction to pregabalin, other medicines, foods, dyes, or preservatives  pregnant or trying to get pregnant  breast-feeding How should I use this medicine? Take this medicine by mouth with water. Take it as directed on the prescription label at the same time every day. You can take it with or without food. If it upsets your stomach, take it with food. Keep taking it unless your health care provider tells you to stop. A special MedGuide will be given to you by the pharmacist with each prescription and refill. Be sure to read this information carefully each time. Talk to your health care provider about the use of this medicine in children. While it may be prescribed for children as young as 1 month for selected conditions, precautions do apply. Overdosage: If you think you have taken too much of this medicine contact a poison control center or emergency room at once. NOTE: This medicine is only for you. Do not share this medicine with others. What if I miss a dose? If you miss a dose, take it as soon as you can. If it is almost time for your next dose, take only that dose. Do not take double or extra doses. What may interact with this medicine?  alcohol  antihistamines for allergy, cough, and cold  certain medicines for anxiety or sleep  certain medicines for blood pressure, heart  disease  certain medicines for depression like amitriptyline, fluoxetine, sertraline  certain medicines for diabetes, like pioglitazone, rosiglitazone  certain medicines for seizures like phenobarbital, primidone  general anesthetics like halothane, isoflurane, methoxyflurane, propofol  medicines that relax muscles for surgery  narcotic medicines for pain  phenothiazines like chlorpromazine, mesoridazine, prochlorperazine, thioridazine This list may not describe all possible interactions. Give your health care provider a list of all the medicines, herbs, non-prescription drugs, or dietary supplements you use. Also tell them if you smoke, drink alcohol, or use illegal drugs. Some items may interact with your medicine. What should I watch for while using this medicine? Visit your health care provider for regular checks on your progress. Tell your health care provider if your symptoms do not start to get better or if they get worse. Do not suddenly stop taking this medicine. You may develop a severe reaction. Your health care provider will tell you how much medicine to take. If your health care provider wants you to stop the medicine, the dose may be slowly lowered over time to avoid any side effects. You may get drowsy or dizzy. Do not drive, use machinery, or do anything that needs mental alertness until you know how this medicine affects you. Do not stand up or sit up quickly, especially if you are an older patient. This reduces the risk of dizzy or fainting spells. Alcohol may interfere with the effect of this medicine. Avoid alcoholic drinks. If you or your family notice any changes in your   behavior, such as new or worsening depression, thoughts of harming yourself, anxiety, other unusual or disturbing thoughts, or memory loss, call your health care provider right away. Wear a medical ID bracelet or chain if you are taking this medicine for seizures. Carry a card that describes your condition.  List the medicines and doses you take on the card. This medicine may make it more difficult to father a child. Talk to your health care provider if you are concerned about your fertility. What side effects may I notice from receiving this medicine? Side effects that you should report to your doctor or health care provider as soon as possible:  allergic reactions (skin rash, itching or hives; swelling of the face, lips, or tongue)  changes in vision  edema (sudden weight gain; swelling of the ankles, feet, hands or other unusual swelling; trouble breathing)  muscle injury (dark urine; trouble passing urine or change in the amount of urine; unusually weak or tired; muscle pain; back pain)  suicidal thoughts, mood changes  trouble breathing  unusual bruising or bleeding Side effects that usually do not require medical attention (report these to your doctor or health care provider if they continue or are bothersome):  dizziness  dry mouth  tiredness  weight gain This list may not describe all possible side effects. Call your doctor for medical advice about side effects. You may report side effects to FDA at 1-800-FDA-1088. Where should I keep my medicine? Keep out of the reach of children and pets. This medicine can be abused. Keep it in a safe place to protect it from theft. Do not share it with anyone. It is only for you. Selling or giving away this medicine is dangerous and against the law. Store at 25 degrees C (77 degrees F). Get rid of any unused medicine after the expiration date. This medicine may cause harm and death if it is taken by other adults, children, or pets. It is important to get rid of the medicine as soon as you no longer need it or it is expired. You can do this in two ways:  Take the medicine to a medicine take-back program. Check with your pharmacy or law enforcement to find a location.  If you cannot return the medicine, check the label or package insert to see  if the medicine should be thrown out in the garbage or flushed down the toilet. If you are not sure, ask your health care provider. If it is safe to put it in the trash, take the medicine out of the container. Mix the medicine with cat litter, dirt, coffee grounds, or other unwanted substance. Seal the mixture in a bag or container. Put it in the trash. NOTE: This sheet is a summary. It may not cover all possible information. If you have questions about this medicine, talk to your doctor, pharmacist, or health care provider.  2021 Elsevier/Gold Standard (2019-07-13 22:46:17)  

## 2020-08-31 NOTE — Progress Notes (Signed)
Subjective: 55 year old male presents the office today for concerns of chronic foot pain.  I last saw him on February 13, 2018.  He was complaining of pins-and-needles sensation to both feet.  He was started on gabapentin but he had to discontinue taking this due to personality changes.  At that time his A1c was 9.6.  He is seen today the gabapentin was helping however.  While he was on gabapentin he was not having tingling sensations.  His A1c is down to 6.8 however he still having the sensations.  He also gets some left foot discomfort.  Denies any recent injury or trauma.  No weakness or falls. Denies any systemic complaints such as fevers, chills, nausea, vomiting. No acute changes since last appointment, and no other complaints at this time.   Objective: AAO x3, NAD DP/PT pulses palpable bilaterally, CRT less than 3 seconds Sensation is intact to Semmes-Weinstein monofilament.  Still describing the nerve symptoms however. On the left side there is tenderness on the course the peroneal tendon lateral aspect of the foot.  Tendon appears to be intact.  There is no area pinpoint tenderness.  No edema, erythema.  MMT 5/5. No pain with calf compression, swelling, warmth, erythema  Assessment: Tendinitis left side, neuropathy  Plan: -All treatment options discussed with the patient including all alternatives, risks, complications.  -Given ongoing nerve issues will refer to neurology.  Previously tried gabapentin which was helpful but due to side effects was not able to tolerate it.  We will try with Lyrica.  This was ordered today and discussed side effects of the medication. -In regards to the tendinitis prescribed meloxicam discussed side effects.  Discussed shoe modifications, inserts. -Patient encouraged to call the office with any questions, concerns, change in symptoms.   Trula Slade DPM

## 2020-09-16 ENCOUNTER — Other Ambulatory Visit: Payer: Self-pay | Admitting: Podiatry

## 2020-09-16 NOTE — Telephone Encounter (Signed)
Please advise 

## 2020-10-24 ENCOUNTER — Other Ambulatory Visit: Payer: Self-pay | Admitting: Podiatry

## 2020-10-28 ENCOUNTER — Encounter: Payer: Self-pay | Admitting: Podiatry

## 2020-10-28 ENCOUNTER — Other Ambulatory Visit: Payer: Self-pay

## 2020-10-28 ENCOUNTER — Ambulatory Visit: Payer: BC Managed Care – PPO | Admitting: Podiatry

## 2020-10-28 DIAGNOSIS — M79671 Pain in right foot: Secondary | ICD-10-CM | POA: Diagnosis not present

## 2020-10-28 DIAGNOSIS — M7750 Other enthesopathy of unspecified foot: Secondary | ICD-10-CM

## 2020-10-28 DIAGNOSIS — E1149 Type 2 diabetes mellitus with other diabetic neurological complication: Secondary | ICD-10-CM

## 2020-10-28 DIAGNOSIS — M79672 Pain in left foot: Secondary | ICD-10-CM | POA: Diagnosis not present

## 2020-10-28 NOTE — Patient Instructions (Signed)

## 2020-10-31 NOTE — Progress Notes (Signed)
Subjective: 55 year old male presents the office today for follow evaluation of chronic foot pain, tendinitis and neuropathy.  He states that since he got new shoes his feet feel much better.  Still has some tingling but not having the soreness that he was.  He is continuing with Lyrica without noticing much of a difference.  He has a neurology appointment in August.  The soreness has resolved but the tingling is still present. Denies any systemic complaints such as fevers, chills, nausea, vomiting. No acute changes since last appointment, and no other complaints at this time.   Objective: AAO x3, NAD DP/PT pulses palpable bilaterally, CRT less than 3 seconds Left side there is no severe tenderness along the course the peroneal tendons lateral aspect of foot.  No area pinpoint tenderness.  No edema, erythema.  MMT 5/5.  Normal sensation appears to be intact with some tingling to his feet. No open lesions or pre-ulcerative lesions.  No pain with calf compression, swelling, warmth, erythema  Assessment: 55 year old male with tendinitis, possible neuropathy  Plan: -All treatment options discussed with the patient including all alternatives, risks, complications.  -From a swelling standpoint he is doing much better with the new shoes and arch support.  Discussed we will schedule exercise daily.  I do still recommend follow-up with neurology given the tingling evaluate for neuropathy. -Patient encouraged to call the office with any questions, concerns, change in symptoms.   Trula Slade DPM

## 2020-11-02 ENCOUNTER — Other Ambulatory Visit: Payer: Self-pay | Admitting: Podiatry

## 2020-12-02 ENCOUNTER — Other Ambulatory Visit: Payer: Self-pay

## 2020-12-02 ENCOUNTER — Encounter: Payer: Self-pay | Admitting: Neurology

## 2020-12-02 ENCOUNTER — Ambulatory Visit: Payer: BC Managed Care – PPO | Admitting: Neurology

## 2020-12-02 VITALS — BP 130/90 | HR 62 | Ht 72.0 in | Wt 221.0 lb

## 2020-12-02 DIAGNOSIS — R202 Paresthesia of skin: Secondary | ICD-10-CM | POA: Diagnosis not present

## 2020-12-02 DIAGNOSIS — R799 Abnormal finding of blood chemistry, unspecified: Secondary | ICD-10-CM | POA: Diagnosis not present

## 2020-12-02 MED ORDER — DULOXETINE HCL 60 MG PO CPEP: 60.0000 mg | ORAL_CAPSULE | Freq: Every day | ORAL | 11 refills | Status: DC

## 2020-12-02 NOTE — Progress Notes (Signed)
Chief Complaint  Patient presents with   New Patient (Initial Visit)    Diabetic neuropathy, new rm, states pain is worse in both feet,      ASSESSMENT AND PLAN  Steve Ritter is a 55 y.o. male   Bilateral lower extremity paresthesia  Suggestive of peripheral neuropathy  Laboratory evaluation for etiology  EMG nerve conduction study  He tried Lyrica 75 mg twice daily was limited help, previous gabapentin caused personality change, will add on Cymbalta 60 mg daily   DIAGNOSTIC DATA (LABS, IMAGING, TESTING) - I reviewed patient records, labs, notes, testing and imaging myself where available.   MEDICAL HISTORY:  Steve Ritter, is a 55 year old male, seen in request by his primary care physician Dr. For evaluation Steve Ritter, [name], bilateral feet paresthesia, initial evaluation was on December 02, 2020   I reviewed and summarized the referring note. PMHX. DM since 2018. HTN OSA, uncomfortable.  He began to have bilateral feet numbness tingling since 2020, initially only involving bottom of his feet, now becoming intense, still stay at the plantar surface, but often feels discomfort after weightbearing  He had a history of low back surgery in the past, has frequent low back pain, occasionally will wake right lower extremity, he denies bowel and bladder incontinence,   PHYSICAL EXAM:   Vitals:   12/02/20 1040  BP: 130/90  Pulse: 62  Weight: 221 lb (100.2 kg)  Height: 6' (1.829 m)   Not recorded     Body mass index is 29.97 kg/m.  PHYSICAL EXAMNIATION:  Gen: NAD, conversant, well nourised, well groomed                     Cardiovascular: Regular rate rhythm, no peripheral edema, warm, nontender. Eyes: Conjunctivae clear without exudates or hemorrhage Neck: Supple, no carotid bruits. Pulmonary: Clear to auscultation bilaterally   NEUROLOGICAL EXAM:  MENTAL STATUS: Speech:    Speech is normal; fluent and spontaneous with normal comprehension.  Cognition:      Orientation to time, place and person     Normal recent and remote memory     Normal Attention span and concentration     Normal Language, naming, repeating,spontaneous speech     Fund of knowledge   CRANIAL NERVES: CN II: Visual fields are full to confrontation. Pupils are round equal and briskly reactive to light. CN III, IV, VI: extraocular movement are normal. No ptosis. CN V: Facial sensation is intact to light touch CN VII: Face is symmetric with normal eye closure  CN VIII: Hearing is normal to causal conversation. CN IX, X: Phonation is normal. CN XI: Head turning and shoulder shrug are intact  MOTOR: There is no pronator drift of out-stretched arms. Muscle bulk and tone are normal. Muscle strength is normal.  REFLEXES: Reflexes are 1 and symmetric at the biceps, triceps, knees, and absent at ankles. Plantar responses are flexor.  SENSORY: Intact to light touch, pinprick and vibratory sensation are intact in fingers and toes.  COORDINATION: There is no trunk or limb dysmetria noted.  GAIT/STANCE: Posture is normal. Gait is steady with normal steps, base, arm swing, and turning. Heel and toe walking are normal. Tandem gait is normal.  Romberg is absent.  REVIEW OF SYSTEMS:  Full 14 system review of systems performed and notable only for as above All other review of systems were negative.   ALLERGIES: Allergies  Allergen Reactions   Cephalexin Hives    Other reaction(s): ITCHING  Hydroxyzine Hcl Hives    Other reaction(s): ITCHING   Other Swelling    Other reaction(s): DIFFICULTY BREATHING   Shellfish Allergy Anaphylaxis    Other reaction(s): DIFFICULTY BREATHING   Hydrocodone-Acetaminophen Hives   Codeine    Erythromycin    Penicillins     HOME MEDICATIONS: Current Outpatient Medications  Medication Sig Dispense Refill   ALPRAZolam (XANAX) 0.5 MG tablet Take 1 tablet by mouth as needed. anxiety  1   amLODipine (NORVASC) 5 MG tablet Take 5 mg by mouth  daily.  3   aspirin EC 81 MG tablet Take 81 mg by mouth.     blood glucose meter kit and supplies KIT Dispense based on patient and insurance preference. Use up to four times daily as directed. (FOR ICD-9 250.00, 250.01). 1 each 0   fenofibrate (TRICOR) 145 MG tablet SMARTSIG:1 Tablet(s) By Mouth Every Evening     glucose blood test strip Use to test blood sugar up to 4 times daily. 400 each 3   JARDIANCE 25 MG TABS tablet Take 25 mg by mouth daily.  4   meloxicam (MOBIC) 15 MG tablet TAKE 1 TABLET (15 MG TOTAL) BY MOUTH DAILY. 30 tablet 0   metFORMIN (GLUCOPHAGE-XR) 500 MG 24 hr tablet Take 1,000 mg by mouth 2 (two) times daily.     omeprazole (PRILOSEC) 20 MG capsule Take 20 mg by mouth daily.     omeprazole (PRILOSEC) 40 MG capsule Take 40 mg by mouth daily.  2   Oxymetazoline HCl (NASAL SPRAY) 0.05 % SOLN Place into the nose.     polyethylene glycol (MIRALAX / GLYCOLAX) packet Take 1 packet by mouth daily.     Polyethylene Glycol 3350 GRAN Take by mouth.     pregabalin (LYRICA) 75 MG capsule TAKE 1 CAPSULE BY MOUTH TWICE A DAY 60 capsule 1   sildenafil (REVATIO) 20 MG tablet Take by mouth.     varenicline (CHANTIX) 0.5 MG tablet Take by mouth.     varenicline (CHANTIX) 1 MG tablet Take 1 tablet by mouth daily.     No current facility-administered medications for this visit.    PAST MEDICAL HISTORY: Past Medical History:  Diagnosis Date   Bloody diarrhea 06/2011   Left sided colitis per CT. Colonoscopy with segmental colitis at 50 to 25 cm, propably ischemic.    Fatty liver 08/2015   Hypertension    Obesity 08/2015   BMI 32.    OSA (obstructive sleep apnea) 2015   dx per nerologist in [place].    Peyronie disease     PAST SURGICAL HISTORY: Past Surgical History:  Procedure Laterality Date   BILATERAL CARPAL TUNNEL RELEASE Bilateral    COLONOSCOPY  06/2011   [name], [place].  acute colitis at 50 to 25 cm, propably ischemic.  removed small rectal polyp  (hyperplastic on path)    ESOPHAGOGASTRODUODENOSCOPY N/A 08/20/2015   Procedure: ESOPHAGOGASTRODUODENOSCOPY (EGD);  Surgeon: [name], MD;  Location: [facility];  Service: Endoscopy;  Laterality: N/A;    FAMILY HISTORY: Family History  Problem Relation Age of Onset   Hypertension Mother    Diabetes Father    Heart disease Father    Hypertension Father    Stroke Father    Mental retardation Father     SOCIAL HISTORY: Social History   Socioeconomic History   Marital status: Single    Spouse name: Not on file   Number of children: Not on file   Years of education: Not  on file   Highest education level: Not on file  Occupational History   Not on file  Tobacco Use   Smoking status: Former   Smokeless tobacco: Never  Substance and Sexual Activity   Alcohol use: Not on file   Drug use: Not on file   Sexual activity: Not on file  Other Topics Concern   Not on file  Social History Narrative   Not on file   Social Determinants of Health   Financial Resource Strain: Not on file  Food Insecurity: Not on file  Transportation Needs: Not on file  Physical Activity: Not on file  Stress: Not on file  Social Connections: Not on file  Intimate Partner Violence: Not on file      Marcial Pacas, M.D. Ph.D.  [facility] [address], [place], [place] [address] Ph: [phone] Fax: [phone]  CC:  Velna Hatchet, MD [place],  [place] [address]  Velna Hatchet, MD

## 2020-12-05 ENCOUNTER — Other Ambulatory Visit: Payer: Self-pay | Admitting: Podiatry

## 2020-12-05 LAB — COMPREHENSIVE METABOLIC PANEL
ALT: 58 IU/L — ABNORMAL HIGH (ref 0–44)
AST: 31 IU/L (ref 0–40)
Albumin/Globulin Ratio: 2.2 (ref 1.2–2.2)
Albumin: 4.7 g/dL (ref 3.8–4.9)
Alkaline Phosphatase: 71 IU/L (ref 44–121)
BUN/Creatinine Ratio: 25 — ABNORMAL HIGH (ref 9–20)
BUN: 21 mg/dL (ref 6–24)
Bilirubin Total: 0.5 mg/dL (ref 0.0–1.2)
CO2: 19 mmol/L — ABNORMAL LOW (ref 20–29)
Calcium: 10 mg/dL (ref 8.7–10.2)
Chloride: 104 mmol/L (ref 96–106)
Creatinine, Ser: 0.84 mg/dL (ref 0.76–1.27)
Globulin, Total: 2.1 g/dL (ref 1.5–4.5)
Glucose: 134 mg/dL — ABNORMAL HIGH (ref 65–99)
Potassium: 4.4 mmol/L (ref 3.5–5.2)
Sodium: 140 mmol/L (ref 134–144)
Total Protein: 6.8 g/dL (ref 6.0–8.5)
eGFR: 104 mL/min/{1.73_m2} (ref >59)

## 2020-12-05 LAB — MULTIPLE MYELOMA PANEL, SERUM
Albumin SerPl Elph-Mcnc: 3.9 g/dL (ref 2.9–4.4)
Albumin/Glob SerPl: 1.4 (ref 0.7–1.7)
Alpha 1: 0.2 g/dL (ref 0.0–0.4)
Alpha2 Glob SerPl Elph-Mcnc: 0.9 g/dL (ref 0.4–1.0)
B-Globulin SerPl Elph-Mcnc: 1.1 g/dL (ref 0.7–1.3)
Gamma Glob SerPl Elph-Mcnc: 0.7 g/dL (ref 0.4–1.8)
Globulin, Total: 2.9 g/dL (ref 2.2–3.9)
IgA/Immunoglobulin A, Serum: 151 mg/dL (ref 90–386)
IgG (Immunoglobin G), Serum: 542 mg/dL — ABNORMAL LOW (ref 603–1613)
IgM (Immunoglobulin M), Srm: 182 mg/dL — ABNORMAL HIGH (ref 20–172)

## 2020-12-05 LAB — SEDIMENTATION RATE: Sed Rate: 3 mm/hr (ref 0–30)

## 2020-12-05 LAB — ANA W/REFLEX IF POSITIVE
Anti JO-1: <0.2 AI (ref 0.0–0.9)
Anti Nuclear Antibody (ANA): POSITIVE — AB
Centromere Ab Screen: <0.2 AI (ref 0.0–0.9)
Chromatin Ab SerPl-aCnc: <0.2 AI (ref 0.0–0.9)
ENA RNP Ab: 1.8 AI — ABNORMAL HIGH (ref 0.0–0.9)
ENA SM Ab Ser-aCnc: <0.2 AI (ref 0.0–0.9)
ENA SSA (RO) Ab: 0.2 AI (ref 0.0–0.9)
ENA SSB (LA) Ab: 2.3 AI — ABNORMAL HIGH (ref 0.0–0.9)
Scleroderma (Scl-70) (ENA) Antibody, IgG: <0.2 AI (ref 0.0–0.9)
dsDNA Ab: <1 IU/mL (ref 0–9)

## 2020-12-05 LAB — CBC WITH DIFFERENTIAL/PLATELET
Basophils Absolute: 0.1 10*3/uL (ref 0.0–0.2)
Basos: 1 %
EOS (ABSOLUTE): 0.2 10*3/uL (ref 0.0–0.4)
Eos: 3 %
Hematocrit: 50.1 % (ref 37.5–51.0)
Hemoglobin: 17 g/dL (ref 13.0–17.7)
Immature Grans (Abs): 0 10*3/uL (ref 0.0–0.1)
Immature Granulocytes: 0 %
Lymphocytes Absolute: 2.4 10*3/uL (ref 0.7–3.1)
Lymphs: 25 %
MCH: 32 pg (ref 26.6–33.0)
MCHC: 33.9 g/dL (ref 31.5–35.7)
MCV: 94 fL (ref 79–97)
Monocytes Absolute: 0.6 10*3/uL (ref 0.1–0.9)
Monocytes: 6 %
Neutrophils Absolute: 6 10*3/uL (ref 1.4–7.0)
Neutrophils: 65 %
Platelets: 197 10*3/uL (ref 150–450)
RBC: 5.32 x10E6/uL (ref 4.14–5.80)
RDW: 12.2 % (ref 11.6–15.4)
WBC: 9.3 10*3/uL (ref 3.4–10.8)

## 2020-12-05 LAB — HIV ANTIBODY (ROUTINE TESTING W REFLEX): HIV Screen 4th Generation wRfx: NONREACTIVE

## 2020-12-05 LAB — RPR: RPR Ser Ql: NONREACTIVE

## 2020-12-05 LAB — CK: Total CK: 258 U/L (ref 41–331)

## 2020-12-05 LAB — TSH: TSH: 0.962 u[IU]/mL (ref 0.450–4.500)

## 2020-12-05 LAB — HGB A1C W/O EAG: Hgb A1c MFr Bld: 7.4 % — ABNORMAL HIGH (ref 4.8–5.6)

## 2020-12-05 LAB — VITAMIN D 25 HYDROXY (VIT D DEFICIENCY, FRACTURES): Vit D, 25-Hydroxy: 22.8 ng/mL — ABNORMAL LOW (ref 30.0–100.0)

## 2020-12-05 LAB — VITAMIN B12: Vitamin B-12: 362 pg/mL (ref 232–1245)

## 2020-12-05 LAB — FOLATE: Folate: 6.7 ng/mL (ref >3.0)

## 2020-12-05 LAB — C-REACTIVE PROTEIN: CRP: 2 mg/L (ref 0–10)

## 2020-12-05 NOTE — Telephone Encounter (Signed)
Please advise 

## 2020-12-08 ENCOUNTER — Telehealth: Payer: Self-pay | Admitting: Neurology

## 2020-12-08 NOTE — Telephone Encounter (Signed)
Please call patient, laboratory evaluation showed  Decreased vitamin D 23, he will benefit D3 supplement 1000 units daily  A1c 7.4, he should contact his primary care for better control of diabetes,  Positive ANA, with positive ANA RNP antibody, SSB antibody, it is unknown clinical significance, may consider repeat lab test later  Rest of the laboratory evaluation showed no significant abnormalities.  I have forwarded laboratory evaluation to his primary care doctor Velna Hatchet, MD

## 2020-12-08 NOTE — Telephone Encounter (Signed)
I spoke to patient. He verbalized understanding of the lab findings. He is agreeable to start the recommended vitamin D supplement and follow up with his PCP about his A1C.

## 2020-12-24 ENCOUNTER — Other Ambulatory Visit: Payer: Self-pay | Admitting: Neurology

## 2020-12-29 ENCOUNTER — Ambulatory Visit (INDEPENDENT_AMBULATORY_CARE_PROVIDER_SITE_OTHER): Payer: BC Managed Care – PPO | Admitting: Podiatry

## 2020-12-29 ENCOUNTER — Encounter: Payer: Self-pay | Admitting: Podiatry

## 2020-12-29 ENCOUNTER — Other Ambulatory Visit: Payer: Self-pay

## 2020-12-29 DIAGNOSIS — M7750 Other enthesopathy of unspecified foot: Secondary | ICD-10-CM | POA: Diagnosis not present

## 2020-12-29 DIAGNOSIS — E1149 Type 2 diabetes mellitus with other diabetic neurological complication: Secondary | ICD-10-CM | POA: Diagnosis not present

## 2020-12-29 DIAGNOSIS — M79671 Pain in right foot: Secondary | ICD-10-CM | POA: Diagnosis not present

## 2020-12-29 DIAGNOSIS — M79672 Pain in left foot: Secondary | ICD-10-CM | POA: Diagnosis not present

## 2020-12-29 NOTE — Progress Notes (Signed)
Subjective: 55 year old male presents the office today for follow-up evaluation neuropathy as well as tendinitis.  He has followed up with neurology.  His A1c is still elevated and has had a low vitamin D.  Small with his primary care physician for blood work tomorrow.  Also he states the ankle, tendinitis has been doing well but has been on meloxicam daily.  No recent injury or changes.  Objective: AAO x3, NAD DP/PT pulses palpable bilaterally, CRT less than 3 seconds Sensation appears to be intact asymptomatic to monofilament is still describing burning, tingling to his feet. There is no significant discomfort to bilateral lower extremities today.  Particular is no pain with course the peroneal tendons on the lateral aspect of the ankle bilaterally.  Flexor, extensor tendons appear to be intact.  MMT 5/5.  No area pinpoint tenderness. No open lesions or pre-ulcerative lesions.  No pain with calf compression, swelling, warmth, erythema  Assessment: 55 year old male with tendinitis, neuropathy  Plan: -All treatment options discussed with the patient including all alternatives, risks, complications.  -He has been placed on duloxetine 60 mg daily for neuropathy per neurology.  He has seen some improvement since starting this.  Continue follow-up with neurology. -Discussed with him taking off skin only as needed.  Discussed shoes and good arch support as well as general rehab, stretching exercises for the ankle. -Patient encouraged to call the office with any questions, concerns, change in symptoms.   Trula Slade DPM

## 2020-12-30 DIAGNOSIS — E1140 Type 2 diabetes mellitus with diabetic neuropathy, unspecified: Secondary | ICD-10-CM | POA: Diagnosis not present

## 2020-12-30 DIAGNOSIS — Z125 Encounter for screening for malignant neoplasm of prostate: Secondary | ICD-10-CM | POA: Diagnosis not present

## 2020-12-30 DIAGNOSIS — E785 Hyperlipidemia, unspecified: Secondary | ICD-10-CM | POA: Diagnosis not present

## 2021-01-04 ENCOUNTER — Other Ambulatory Visit: Payer: Self-pay | Admitting: Podiatry

## 2021-01-06 DIAGNOSIS — Z1339 Encounter for screening examination for other mental health and behavioral disorders: Secondary | ICD-10-CM | POA: Diagnosis not present

## 2021-01-06 DIAGNOSIS — Z1331 Encounter for screening for depression: Secondary | ICD-10-CM | POA: Diagnosis not present

## 2021-01-06 DIAGNOSIS — I10 Essential (primary) hypertension: Secondary | ICD-10-CM | POA: Diagnosis not present

## 2021-01-06 DIAGNOSIS — Z0000 Encounter for general adult medical examination without abnormal findings: Secondary | ICD-10-CM | POA: Diagnosis not present

## 2021-01-06 DIAGNOSIS — F17200 Nicotine dependence, unspecified, uncomplicated: Secondary | ICD-10-CM | POA: Diagnosis not present

## 2021-01-06 DIAGNOSIS — E1169 Type 2 diabetes mellitus with other specified complication: Secondary | ICD-10-CM | POA: Diagnosis not present

## 2021-02-04 ENCOUNTER — Ambulatory Visit (INDEPENDENT_AMBULATORY_CARE_PROVIDER_SITE_OTHER): Payer: BC Managed Care – PPO | Admitting: Neurology

## 2021-02-04 ENCOUNTER — Other Ambulatory Visit: Payer: Self-pay

## 2021-02-04 ENCOUNTER — Ambulatory Visit: Payer: BC Managed Care – PPO | Admitting: Neurology

## 2021-02-04 DIAGNOSIS — G629 Polyneuropathy, unspecified: Secondary | ICD-10-CM

## 2021-02-04 DIAGNOSIS — R202 Paresthesia of skin: Secondary | ICD-10-CM | POA: Diagnosis not present

## 2021-02-04 NOTE — Progress Notes (Signed)
No chief complaint on file.     ASSESSMENT AND PLAN  Steve Ritter is a 55 y.o. male   Bilateral lower extremity paresthesia  Most consistent with small fiber neuropathy, some residual symptoms from previous right lumbar radiculopathy  A1c was 7.4, also mildly vitamin D deficiency 22,  Symptoms has improved with Lyrica 75 mg twice a day along with Cymbalta 30 mg daily,  Emphasized importance of tight diabetes control, moderate exercise,  He will continue follow-up with his primary care physician for positive ANA, SSB antibody, he does has frequent dry mouth, denies dry eye,   DIAGNOSTIC DATA (LABS, IMAGING, TESTING) - I reviewed patient records, labs, notes, testing and imaging myself where available.   MEDICAL HISTORY:  Steve Ritter, is a 55 year old male, seen in request by his primary care physician Dr. For evaluation [name], [name], bilateral feet paresthesia, initial evaluation was on December 02, 2020   I reviewed and summarized the referring note. PMHX. DM since 2018. HTN OSA, uncomfortable.  He began to have bilateral feet numbness tingling since 2020, initially only involving bottom of his feet, now becoming intense, still stay at the plantar surface, but often feels discomfort after weightbearing  He had a history of low back surgery in the past, has frequent low back pain, occasionally will wake right lower extremity, he denies bowel and bladder incontinence,  Update February 04, 2021:  He return for electrodiagnostic study, no evidence of large fiber peripheral neuropathy, mild right lumbar radiculopathy mainly involving right L4-5, no evidence of active process  Review of the laboratory evaluation in August 2022: Normal or negative HIV, RPR, B12, TSH, C-reactive protein, ESR, elevated A1c 7.4, positive ANA, with positive SSB antibody, normal CBC, mildly decreased vitamin D 22  He was started on Cymbalta 60 mg along with the previous Lyrica 75 twice a day,  complains of difficulty starting urine, Cymbalta dosage was decreased to 30 mg, the side effect has subsided, the combination did help his symptoms better, he complains of numbness, sensitivity at the bottom of his feet, felt cushion when he bearing weight,  Had a history of lumbar decompression surgery, reported right lumbar radiculopathy prior to surgery, only occasionally flareup of low back pain, no gait abnormality, no bowel bladder incontinence  PHYSICAL EXAM:   There were no vitals filed for this visit.  Not recorded     There is no height or weight on file to calculate BMI.  PHYSICAL EXAMNIATION:  Gen: NAD, conversant, well nourised, well groomed      NEUROLOGICAL EXAM:  MENTAL STATUS: Speech/cognition: Awake, alert, oriented to history taking and casual conversation   CRANIAL NERVES: CN II: Visual fields are full to confrontation. Pupils are round equal and briskly reactive to light. CN III, IV, VI: extraocular movement are normal. No ptosis. CN V: Facial sensation is intact to light touch CN VII: Face is symmetric with normal eye closure  CN VIII: Hearing is normal to causal conversation. CN IX, X: Phonation is normal. CN XI: Head turning and shoulder shrug are intact  MOTOR: There is no pronator drift of out-stretched arms. Muscle bulk and tone are normal. Muscle strength is normal.  REFLEXES: Reflexes are 1 and symmetric at the biceps, triceps, knees, and absent at ankles. Plantar responses are flexor.  SENSORY: Decreased vibratory sensation in toes, mildly length dependent decreased pinprick to ankle level  COORDINATION: There is no trunk or limb dysmetria noted.  GAIT/STANCE: Posture is normal. Gait is steady with normal  steps, base, arm swing, and turning. Heel and toe walking are normal. Tandem gait is normal.  Romberg is absent.  REVIEW OF SYSTEMS:  Full 14 system review of systems performed and notable only for as above All other review of systems  were negative.   ALLERGIES: Allergies  Allergen Reactions   Cephalexin Hives    Other reaction(s): ITCHING   Hydroxyzine Hcl Hives    Other reaction(s): ITCHING   Other Swelling    Other reaction(s): DIFFICULTY BREATHING   Shellfish Allergy Anaphylaxis    Other reaction(s): DIFFICULTY BREATHING   Hydrocodone-Acetaminophen Hives   Codeine    Erythromycin    Penicillins     HOME MEDICATIONS: Current Outpatient Medications  Medication Sig Dispense Refill   ALPRAZolam (XANAX) 0.5 MG tablet Take 1 tablet by mouth as needed. anxiety  1   amLODipine (NORVASC) 5 MG tablet Take 5 mg by mouth daily.  3   aspirin EC 81 MG tablet Take 81 mg by mouth.     blood glucose meter kit and supplies KIT Dispense based on patient and insurance preference. Use up to four times daily as directed. (FOR ICD-9 250.00, 250.01). 1 each 0   Cholecalciferol (VITAMIN D3 PO) Take 1,000 Units by mouth daily.     DULoxetine (CYMBALTA) 60 MG capsule TAKE 1 CAPSULE BY MOUTH EVERY DAY 90 capsule 4   fenofibrate (TRICOR) 145 MG tablet SMARTSIG:1 Tablet(s) By Mouth Every Evening     glucose blood test strip Use to test blood sugar up to 4 times daily. 400 each 3   JARDIANCE 25 MG TABS tablet Take 25 mg by mouth daily.  4   meloxicam (MOBIC) 15 MG tablet TAKE 1 TABLET (15 MG TOTAL) BY MOUTH DAILY. 30 tablet 0   metFORMIN (GLUCOPHAGE-XR) 500 MG 24 hr tablet Take 1,000 mg by mouth 2 (two) times daily.     omeprazole (PRILOSEC) 20 MG capsule Take 20 mg by mouth daily.     omeprazole (PRILOSEC) 40 MG capsule Take 40 mg by mouth daily.  2   Oxymetazoline HCl (NASAL SPRAY) 0.05 % SOLN Place into the nose.     polyethylene glycol (MIRALAX / GLYCOLAX) packet Take 1 packet by mouth daily.     Polyethylene Glycol 3350 GRAN Take by mouth.     pregabalin (LYRICA) 75 MG capsule TAKE 1 CAPSULE BY MOUTH TWICE A DAY 60 capsule 1   sildenafil (REVATIO) 20 MG tablet Take by mouth.     varenicline (CHANTIX) 0.5 MG tablet Take by  mouth.     varenicline (CHANTIX) 1 MG tablet Take 1 tablet by mouth daily.     Vitamin D, Cholecalciferol, 25 MCG (1000 UT) TABS Take by mouth.     No current facility-administered medications for this visit.    PAST MEDICAL HISTORY: Past Medical History:  Diagnosis Date   Bloody diarrhea 06/2011   Left sided colitis per CT. Colonoscopy with segmental colitis at 50 to 25 cm, propably ischemic.    Fatty liver 08/2015   Hypertension    Obesity 08/2015   BMI 32.    OSA (obstructive sleep apnea) 2015   dx per nerologist in [place].    Peyronie disease     PAST SURGICAL HISTORY: Past Surgical History:  Procedure Laterality Date   BILATERAL CARPAL TUNNEL RELEASE Bilateral    COLONOSCOPY  06/2011   [name], [place].  acute colitis at 50 to 25 cm, propably ischemic.  removed small rectal polyp (hyperplastic on path)  ESOPHAGOGASTRODUODENOSCOPY N/A 08/20/2015   Procedure: ESOPHAGOGASTRODUODENOSCOPY (EGD);  Surgeon: [name], MD;  Location: [facility];  Service: Endoscopy;  Laterality: N/A;    FAMILY HISTORY: Family History  Problem Relation Age of Onset   Hypertension Mother    Diabetes Father    Heart disease Father    Hypertension Father    Stroke Father    Mental retardation Father     SOCIAL HISTORY: Social History   Socioeconomic History   Marital status: Single    Spouse name: Not on file   Number of children: Not on file   Years of education: Not on file   Highest education level: Not on file  Occupational History   Not on file  Tobacco Use   Smoking status: Former   Smokeless tobacco: Never  Substance and Sexual Activity   Alcohol use: Not on file   Drug use: Not on file   Sexual activity: Not on file  Other Topics Concern   Not on file  Social History Narrative   Not on file   Social Determinants of Health   Financial Resource Strain: Not on file  Food Insecurity: Not on file  Transportation Needs: Not on file  Physical  Activity: Not on file  Stress: Not on file  Social Connections: Not on file  Intimate Partner Violence: Not on file      Marcial Pacas, M.D. Ph.D.  [facility] [address], [place], [place] [address] Ph: [phone] Fax: [phone]  CC:  Velna Hatchet, MD [place],  [place] [address]  Velna Hatchet, MD

## 2021-02-04 NOTE — Procedures (Signed)
Full Name: Davide Risdon Gender: Male MRN #: [id number] Date of Birth: [birth year]    Visit Date: 02/04/2021 08:10 Age: 55 Years Examining Physician: Marcial Pacas, MD  Referring Physician: Marcial Pacas, MD Height: 6 feet 0 inch Patient History: 221lbs History: 55 year old male with history of diabetes, presenting with bilateral feet paresthesia, also had a history of right lumbar radiculopathy, status post decompression surgery  Summary of the test:  Nerve conduction study:  Bilateral sural, superficial peroneal sensory responses were normal.  Bilateral tibial, peroneal to EDB motor responses were normal  Electromyography:  Selected needle examination of bilateral lower extremity muscles, bilateral lumbosacral paraspinal muscles were performed.  There is evidence of mild chronic neuropathic changes at right tibialis anterior, peroneal longus,  There is also evidence of increased insertional activity at right lower lumbar paraspinal muscles.  Conclusion: This is an abnormal study.  There is electrodiagnostic evidence of mild chronic right lumbar radiculopathy, mainly involving right L4 dermatomes.  There is no evidence of large fiber peripheral neuropathy.  The above test could not rule out the possibility of small fiber neuropathy.      ------------------------------- Marcial Pacas M.D. PhD  [facility] [address] [place], [place] [address] Tel: [phone] Fax: [phone]  Verbal informed consent was obtained from the patient, patient was informed of potential risk of procedure, including bruising, bleeding, hematoma formation, infection, muscle weakness, muscle pain, numbness, among others.        [place]    Nerve / Sites Muscle Latency Ref. Amplitude Ref. Rel Amp Segments Distance Velocity Ref. Area    ms ms mV mV %  cm m/s m/s mVms  L Peroneal - EDB     Ankle EDB 4.1 ?6.5 3.9 ?2.0 100 Ankle - EDB 9   19.6     Fib head EDB 10.8  3.5  89.1  Fib head - Ankle 29 44 ?44 16.2     Pop fossa EDB 13.0  3.1  87.1 Pop fossa - Fib head 10 44 ?44 13.4         Pop fossa - Ankle      R Peroneal - EDB     Ankle EDB 5.8 ?6.5 2.6 ?2.0 100 Ankle - EDB 9   13.5     Fib head EDB 12.4  1.7  66.9 Fib head - Ankle 29 44 ?44 13.5     Pop fossa EDB 14.7  1.9  109 Pop fossa - Fib head 10 44 ?44 15.0         Pop fossa - Ankle      L Tibial - AH     Ankle AH 3.5 ?5.8 4.0 ?4.0 100 Ankle - AH 9   20.9     Pop fossa AH 13.3  2.8  70.3 Pop fossa - Ankle 40 41 ?41 11.7  R Tibial - AH     Ankle AH 3.9 ?5.8 4.5 ?4.0 100 Ankle - AH 9   19.8     Pop fossa AH 13.7  2.3  51.5 Pop fossa - Ankle 40 41 ?41 18.6             SNC    Nerve / Sites Rec. Site Peak Lat Ref.  Amp Ref. Segments Distance    ms ms V V  cm  L Sural - Ankle (Calf)     Calf Ankle 4.1 ?4.4 11 ?6 Calf - Ankle 14  R Sural - Ankle (Calf)  Calf Ankle 4.3 ?4.4 15 ?6 Calf - Ankle 14  L Superficial peroneal - Ankle     Lat leg Ankle 4.4 ?4.4 7 ?6 Lat leg - Ankle 14  R Superficial peroneal - Ankle     Lat leg Ankle 4.0 ?4.4 9 ?6 Lat leg - Ankle 14             F  Wave    Nerve F Lat Ref.   ms ms  L Tibial - AH 55.8 ?56.0  R Tibial - AH 54.3 ?56.0         EMG Summary Table    Spontaneous MUAP Recruitment  Muscle IA Fib PSW Fasc Other Amp Dur. Poly Pattern  L. Tibialis anterior Normal None None None _______ Normal Normal Normal Normal  L. Tibialis posterior Normal None None None _______ Normal Normal Normal Normal  L. Peroneus longus Normal None None None _______ Normal Normal Normal Normal  L. Gastrocnemius (Medial head) Normal None None None _______ Normal Normal Normal Normal  L. Vastus lateralis Normal None None None _______ Normal Normal Normal Normal  R. Tibialis anterior Increased None None None _______ Normal Normal Normal Reduced  R. Tibialis posterior Normal None None None _______ Normal Normal Normal Normal  R. Peroneus longus Increased None None None _______ Normal Normal  Normal Reduced  R. Gastrocnemius (Medial head) Normal None None None _______ Normal Normal Normal Normal  R. Vastus lateralis Normal None None None _______ Normal Normal Normal Normal  L. Lumbar paraspinals (low) Normal None None None _______ Normal Normal Normal Normal  L. Lumbar paraspinals (mid) Normal None None None _______ Normal Normal Normal Normal  R. Lumbar paraspinals (low) Increased 1+ None None _______ Normal Normal Normal Normal  R. Lumbar paraspinals (mid) Increased 1+ None None _______ Normal Normal Normal Normal

## 2021-02-23 DIAGNOSIS — D170 Benign lipomatous neoplasm of skin and subcutaneous tissue of head, face and neck: Secondary | ICD-10-CM | POA: Diagnosis not present

## 2021-02-23 DIAGNOSIS — L918 Other hypertrophic disorders of the skin: Secondary | ICD-10-CM | POA: Diagnosis not present

## 2021-05-25 ENCOUNTER — Other Ambulatory Visit: Payer: Self-pay | Admitting: Podiatry

## 2021-07-07 DIAGNOSIS — I10 Essential (primary) hypertension: Secondary | ICD-10-CM | POA: Diagnosis not present

## 2021-07-07 DIAGNOSIS — E1169 Type 2 diabetes mellitus with other specified complication: Secondary | ICD-10-CM | POA: Diagnosis not present

## 2021-08-04 ENCOUNTER — Ambulatory Visit (INDEPENDENT_AMBULATORY_CARE_PROVIDER_SITE_OTHER): Payer: BC Managed Care – PPO

## 2021-08-04 ENCOUNTER — Ambulatory Visit (INDEPENDENT_AMBULATORY_CARE_PROVIDER_SITE_OTHER): Payer: BC Managed Care – PPO | Admitting: Orthopaedic Surgery

## 2021-08-04 ENCOUNTER — Ambulatory Visit: Payer: Self-pay

## 2021-08-04 ENCOUNTER — Encounter: Payer: Self-pay | Admitting: Orthopaedic Surgery

## 2021-08-04 DIAGNOSIS — M25552 Pain in left hip: Secondary | ICD-10-CM

## 2021-08-04 DIAGNOSIS — M25551 Pain in right hip: Secondary | ICD-10-CM

## 2021-08-04 MED ORDER — IBUPROFEN 800 MG PO TABS: 800.0000 mg | ORAL_TABLET | Freq: Three times a day (TID) | ORAL | 2 refills | Status: AC | PRN

## 2021-08-04 NOTE — Progress Notes (Signed)
? ?Office Visit Note ?  ?Patient: Steve Ritter           ?Date of Birth: March 04, 1966           ?MRN: [id number] ?Visit Date: 08/04/2021 ?             ?Requested by: Velna Hatchet, MD ?[address] ?[place],  [place] [address] ?PCP: Velna Hatchet, MD ? ? ?Assessment & Plan: ?Visit Diagnoses:  ?1. Bilateral hip pain   ? ? ?Plan: Impression is bilateral trochanteric hip pain.  Possible etiologies reviewed with the patient and treatment options were explained.  We provided bilateral trochanteric injections today which he tolerated well.  We will also give him home exercises.  Ibuprofen 800 mg prescribed today. ? ?Follow-Up Instructions: No follow-ups on file.  ? ?Orders:  ?Orders Placed This Encounter  ?Procedures  ? XR HIP UNILAT W OR W/O PELVIS 2-3 VIEWS LEFT  ? XR HIP UNILAT W OR W/O PELVIS 2-3 VIEWS RIGHT  ? ?Meds ordered this encounter  ?Medications  ? ibuprofen (ADVIL) 800 MG tablet  ?  Sig: Take 1 tablet (800 mg total) by mouth every 8 (eight) hours as needed.  ?  Dispense:  30 tablet  ?  Refill:  2  ? ? ? ? Procedures: ?Large Joint Inj: bilateral greater trochanter on 08/04/2021 3:33 PM ?Indications: pain ?Details: 22 G needle ?Outcome: tolerated well, no immediate complications ?Patient was prepped and draped in the usual sterile fashion.  ? ? ? ? ?Clinical Data: ?No additional findings. ? ? ?Subjective: ?Chief Complaint  ?Patient presents with  ? Left Hip - Pain  ? Right Hip - Pain  ? ? ?HPI ? ?Steve Ritter is a very pleasant 56 year old male here for bilateral hip pain worse on the left.  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  The pain is worse with standing and activity and using stairs.  Ibuprofen 800 mg does help and would like a prescription for this.  Denies any back pain or radicular symptoms.  He does not have any consistent groin pain. ? ?Review of Systems  ?Constitutional: Negative.   ?All other systems reviewed and are negative. ? ? ?Objective: ?Vital Signs: There were no vitals taken  for this visit. ? ?Physical Exam ?Vitals and nursing note reviewed.  ?Constitutional:   ?   Appearance: He is well-developed.  ?HENT:  ?   Head: Normocephalic and atraumatic.  ?Eyes:  ?   Pupils: Pupils are equal, round, and reactive to light.  ?Pulmonary:  ?   Effort: Pulmonary effort is normal.  ?Abdominal:  ?   Palpations: Abdomen is soft.  ?Musculoskeletal:     ?   General: Normal range of motion.  ?   Cervical back: Neck supple.  ?Skin: ?   General: Skin is warm.  ?Neurological:  ?   Mental Status: He is alert and oriented to person, place, and time.  ?Psychiatric:     ?   Behavior: Behavior normal.     ?   Thought Content: Thought content normal.     ?   Judgment: Judgment normal.  ? ? ?Ortho Exam ? ?Examination of the bilateral hips show preserved range of motion.  His pain is localized to the lateral aspect.  Negative logroll negative [place].  No sciatic tension signs. ? ?Specialty Comments:  ?No specialty comments available. ? ?Imaging: ?No results found. ? ? ?PMFS History: ?Patient Active Problem List  ? Diagnosis Date Noted  ? Small fiber neuropathy  02/04/2021  ? Paresthesia 12/02/2020  ? Constipation 09/26/2018  ? Tendinitis of wrist 09/26/2018  ? Tinea pedis 09/26/2018  ? Tobacco user 09/26/2018  ? 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 Pancreatitis, acute 08/22/2015  ? Abnormal CT scan, small bowel   ? Right sided abdominal pain   ? Fever   ? Duodenitis 08/17/2015  ? Abdominal pain 08/17/2015  ? Essential hypertension 01/25/2015  ? Erectile dysfunction 01/25/2015  ? 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 ANXIETY 11/30/2006  ? ?Past Medical History:  ?Diagnosis Date  ? Bloody diarrhea 06/2011  ? Left sided colitis per CT. Colonoscopy with segmental colitis at 50 to 25 cm, propably ischemic.   ? Fatty liver 08/2015  ? Hypertension   ? Obesity 08/2015  ? BMI 32.   ? OSA (obstructive sleep apnea) 2015  ? dx per  nerologist in [place].   ? Peyronie disease   ?  ?Family History  ?Problem Relation Age of Onset  ? Hypertension Mother   ? Diabetes Father   ? Heart disease Father   ? Hypertension Father   ? Stroke Father   ? Mental retardation Father   ?  ?Past Surgical History:  ?Procedure Laterality Date  ? BILATERAL CARPAL TUNNEL RELEASE Bilateral   ? COLONOSCOPY  06/2011  ? [name], [place].  acute colitis at 50 to 25 cm, propably ischemic.  removed small rectal polyp (hyperplastic on path)   ? ESOPHAGOGASTRODUODENOSCOPY N/A 08/20/2015  ? Procedure: ESOPHAGOGASTRODUODENOSCOPY (EGD);  Surgeon: [name], MD;  Location: [facility];  Service: Endoscopy;  Laterality: N/A;  ? ?Social History  ? ?Occupational History  ? Not on file  ?Tobacco Use  ? Smoking status: Former  ? Smokeless tobacco: Never  ?Substance and Sexual Activity  ? Alcohol use: Not on file  ? Drug use: Not on file  ? Sexual activity: Not on file  ? ? ? ? ? ? ?

## 2021-08-10 ENCOUNTER — Other Ambulatory Visit: Payer: Self-pay | Admitting: Family Medicine

## 2021-08-10 ENCOUNTER — Other Ambulatory Visit (HOSPITAL_COMMUNITY): Payer: Self-pay | Admitting: Family Medicine

## 2021-08-10 DIAGNOSIS — R10A2 Flank pain, left side: Secondary | ICD-10-CM

## 2021-08-10 DIAGNOSIS — E1169 Type 2 diabetes mellitus with other specified complication: Secondary | ICD-10-CM | POA: Diagnosis not present

## 2021-08-10 DIAGNOSIS — I10 Essential (primary) hypertension: Secondary | ICD-10-CM | POA: Diagnosis not present

## 2021-08-10 DIAGNOSIS — R109 Unspecified abdominal pain: Secondary | ICD-10-CM

## 2021-08-10 DIAGNOSIS — R1032 Left lower quadrant pain: Secondary | ICD-10-CM | POA: Diagnosis not present

## 2021-08-11 ENCOUNTER — Ambulatory Visit (HOSPITAL_COMMUNITY)
Admission: RE | Admit: 2021-08-11 | Discharge: 2021-08-11 | Disposition: A | Discharge status: Home / Self Care | Payer: BC Managed Care – PPO | Source: Ambulatory Visit | Attending: Family Medicine | Admitting: Family Medicine

## 2021-08-11 ENCOUNTER — Encounter (HOSPITAL_COMMUNITY): Payer: Self-pay

## 2021-08-11 DIAGNOSIS — N200 Calculus of kidney: Secondary | ICD-10-CM | POA: Diagnosis not present

## 2021-08-11 DIAGNOSIS — R1032 Left lower quadrant pain: Secondary | ICD-10-CM | POA: Insufficient documentation

## 2021-08-11 DIAGNOSIS — R319 Hematuria, unspecified: Secondary | ICD-10-CM | POA: Diagnosis not present

## 2021-08-11 DIAGNOSIS — R109 Unspecified abdominal pain: Secondary | ICD-10-CM | POA: Insufficient documentation

## 2021-08-11 MED ORDER — SODIUM CHLORIDE (PF) 0.9 % IJ SOLN
INTRAMUSCULAR | Status: AC
Start: 2021-08-11 — End: 2021-08-11
  Filled 2021-08-11: qty 50

## 2021-08-11 MED ORDER — IOHEXOL 300 MG/ML  SOLN
100.0000 mL | Freq: Once | INTRAMUSCULAR | Status: AC | PRN
  Administered 2021-08-11: 100 mL via INTRAVENOUS

## 2021-08-18 ENCOUNTER — Encounter: Payer: Self-pay | Admitting: Internal Medicine

## 2021-08-19 NOTE — Progress Notes (Signed)
? ? ? ?08/20/2021 ?Steve Ritter ?[id number] ?31-Jan-1966 ? ?Referring provider: Velna Hatchet, MD ?Primary GI doctor: Dr. [name] ? ?ASSESSMENT AND PLAN:  ? ?Left lower quadrant abdominal pain ?-     dicyclomine (BENTYL) 20 MG tablet; Take 1 tablet (20 mg total) by mouth 3 (three) times daily as needed for spasms (this is longer aciting, take to prevent). ?-     hyoscyamine (LEVSIN) 0.125 MG tablet; Take 1 tablet (0.125 mg total) by mouth every 6 (six) hours as needed for cramping. ?08/11/2021 CT abdomen pelvis with contrast for left upper and left lower quadrant abdominal pain with hematuria.  Moderate diffuse hepatic steatosis unremarkable gallbladder without ductal dilatation.  No pancreatic masses or inflammatory changes.   ?5 mm calculus right kidney, no obstruction or inflammatory process. ?Kidney stone on right side, has some hematuria can follow up with PCP.  ?Patient reassured no evidence of pancreatitis or colitis on recent CT ?Had worsening constipation with ozempic, likely this is contributing, will treat with miralax/fiber if not improved and normal colonoscopy can try linzess.  ? ?Chronic idiopathic constipation ?- Increase fiber/ water intake, decrease caffeine, increase activity level. ?-Will add on Miralax daily and Benefiber ?- Please go to the hospital if you have severe abdominal pain, vomiting, fever, CP, SOB.  ? ?Screen for colon cancer ?We have discussed the risks of bleeding, infection, perforation, medication reactions, and remote risk of death associated with colonoscopy. All questions were answered and the patient acknowledges these risk and wishes to proceed. ?2013 colonoscopy for colitis, hyperplastic polyp, had ulcer without dysplasia, not a great path report, suggest random biopsies with chronic pain/history ? ?Hepatic steatosis ?Seen on CT, has DM2 ?--Continue to work on risk factor modification including diet exercise and control of risk factors including blood sugars. ?- monitor LFT's  q 6 months.  ? ? ?Patient Care Team: ?Velna Hatchet, MD as PCP - General (Internal Medicine) ? ?HISTORY OF PRESENT ILLNESS: ?56 y.o. male with a past medical history of hypertension, hyperlipidemia, sleep apnea, diabetes type 2, history of pancreatitis 2017 and others listed below presents for evaluation of abdominal pain.  ? ?08/11/2021 CT abdomen pelvis with contrast for left upper and left lower quadrant abdominal pain with hematuria.  Moderate diffuse hepatic steatosis unremarkable gallbladder without ductal dilatation.  No pancreatic masses or inflammatory changes.   ?5 mm calculus right kidney, no obstruction or inflammatory process. ? ?Patient lives near [place], 25 mins from here, may have seen Digestive health specialist years ago.  ?Patient has been having left lower AB pain for at least 1 month.  ?His A1C was elevated, did 1 round of ozempic, then Thursday/Friday started to have worsening LLQ pain would have nausea and dry heaves during that time but nothing prior.  ?No fever, chills.  ?Had CT that showed fatty liver, 5 mm kidney stones, normal pancrease.  ?He has had decreased BM's, having it every other day, hard stools for years.  On miralax occasionally.  ?Did have some Had 1 episode of diarrhea after the CT scan and small volume diarrhea this AM.  ?Denies hematochezia, melena No weight loss. ?Took tramadol from PCP for pain last week, no new meds in last 3 months.  ?No history of GI malignancy or colon cancer.  ? ?09/11/2015 office visit with PA Lajuana Ripple after hospital visit for abdominal pain secondary to pancreatitis.   ?No gallstones or CBD obstruction.  Lipase normal. ?Cause of pancreatitis not well known, rare drinker. ?10/06/2015 CT abdomen pelvis with  and without contrast pancreatic protocol showed hepatic steatosis, bilateral kidney stones, significant improvement inflammatory change in around duodenal C-loop but no definite pancreatic mass, small soft tissue nodule likely sequelae of  inflammatory process but recommended follow-up CT in 3 months.  I do not see where this was done. 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 Georjean Mode Showed hyperplastic polyp, ulcer negative for malignancy.  ? ?Colonoscopy 06/2011  ?Anesthesia provided medications. The colonoscope was introduced and advanced to the cecum. The cecum, ascending colon, transverse colon and descending colon were normal. From 50 cm down to 25 cm, there appears to be nonspecific inflammatory areas with some exudate. Biopsies were done. ?It appears to be primarily ischemic colitis although obvious localized inflammatory bowel disease is a consideration. The rectum was basically negative, but one small polyp about 3 mm was identified and removed with biopsy forceps, and retroflexed view of the anorectal area was negative. ?IMPRESSIONS: ?1. Colitis from 50 to 25 cm, probably ischemic. ?2.Small polyp in the rectum, removed with biopsy. ? ?Current Medications:  ? ?Current Outpatient Medications (Endocrine & Metabolic):  ?  JARDIANCE 25 MG TABS tablet, Take 25 mg by mouth daily. ?  metFORMIN (GLUCOPHAGE-XR) 500 MG 24 hr tablet, Take 1,000 mg by mouth 2 (two) times daily. ? ?Current Outpatient Medications (Cardiovascular):  ?  amLODipine (NORVASC) 5 MG tablet, Take 5 mg by mouth daily. ?  fenofibrate (TRICOR) 145 MG tablet, SMARTSIG:1 Tablet(s) By Mouth Every Evening ?  sildenafil (REVATIO) 20 MG tablet, Take by mouth. ? ?Current Outpatient Medications (Respiratory):  ?  Oxymetazoline HCl (NASAL SPRAY) 0.05 % SOLN, Place into the nose. ? ?Current Outpatient Medications (Analgesics):  ?  aspirin EC 81 MG tablet, Take 81 mg by mouth. ?  ibuprofen (ADVIL) 800 MG tablet, Take 1 tablet (800 mg total) by mouth every 8 (eight) hours as needed. ? ? ?Current Outpatient  Medications (Other):  ?  blood glucose meter kit and supplies KIT, Dispense based on patient and insurance preference. Use up to four times daily as directed. (FOR ICD-9 250.00, 250.01). ?  dicyclomine (BENTYL) 20 MG tablet, Take 1 tablet (20 mg total) by mouth 3 (three) times daily as needed for spasms (this is longer aciting, take to prevent). ?  glucose blood test strip, Use to test blood sugar up to 4 times daily. ?  hyoscyamine (LEVSIN) 0.125 MG tablet, Take 1 tablet (0.125 mg total) by mouth every 6 (six) hours as needed for cramping. ?  omeprazole (PRILOSEC) 40 MG capsule, Take 40 mg by mouth daily. ?  Polyethylene Glycol 3350 GRAN, Take by mouth. ? ?Medical History:  ?Past Medical History:  ?Diagnosis Date  ? Bloody diarrhea 06/2011  ? Left sided colitis per CT. Colonoscopy with segmental colitis at 50 to 25 cm, propably ischemic.   ? Fatty liver 08/2015  ? Hypertension   ? Obesity 08/2015  ? BMI 32.   ? OSA (obstructive sleep apnea) 2015  ? dx per nerologist in [place].   ? Peyronie disease   ? ?Allergies:  ?Allergies  ?Allergen Reactions  ? Cephalexin Hives  ?  Other reaction(s): ITCHING  ? Hydroxyzine Hcl Hives  ?  Other reaction(s): ITCHING  ? Other Swelling  ?  Other reaction(s): DIFFICULTY BREATHING  ? Shellfish Allergy Anaphylaxis  ?  Other reaction(s): DIFFICULTY BREATHING  ? Hydrocodone-Acetaminophen Hives  ?  Codeine   ? Erythromycin   ? Penicillins   ?  ? ?Surgical History:  ?He  has a past surgical history that includes Bilateral carpal tunnel release (Bilateral); Esophagogastroduodenoscopy (N/A, 08/20/2015); and Colonoscopy (06/2011). ?Family History:  ?His family history includes Diabetes in his father; Heart disease in his father; Hypertension in his father and mother; Mental retardation in his father; Stroke in his father. ?Social History:  ? reports that he has been smoking cigarettes. He has never used smokeless tobacco. He reports current alcohol use. He reports that he does not use  drugs. ? ?REVIEW OF SYSTEMS  : All other systems reviewed and negative except where noted in the History of Present Illness. ? ? ?PHYSICAL EXAM: ?BP 120/70   Pulse 63   Ht 6' (1.829 m)   Wt 203 lb (92.1 kg)

## 2021-08-20 ENCOUNTER — Other Ambulatory Visit (INDEPENDENT_AMBULATORY_CARE_PROVIDER_SITE_OTHER): Payer: BC Managed Care – PPO

## 2021-08-20 ENCOUNTER — Ambulatory Visit (INDEPENDENT_AMBULATORY_CARE_PROVIDER_SITE_OTHER): Payer: BC Managed Care – PPO | Admitting: Physician Assistant

## 2021-08-20 ENCOUNTER — Encounter: Payer: Self-pay | Admitting: Physician Assistant

## 2021-08-20 VITALS — BP 120/70 | HR 63 | Ht 72.0 in | Wt 203.0 lb

## 2021-08-20 DIAGNOSIS — K760 Fatty (change of) liver, not elsewhere classified: Secondary | ICD-10-CM

## 2021-08-20 DIAGNOSIS — K5904 Chronic idiopathic constipation: Secondary | ICD-10-CM

## 2021-08-20 DIAGNOSIS — R1032 Left lower quadrant pain: Secondary | ICD-10-CM | POA: Diagnosis not present

## 2021-08-20 DIAGNOSIS — Z1211 Encounter for screening for malignant neoplasm of colon: Secondary | ICD-10-CM

## 2021-08-20 LAB — CBC WITH DIFFERENTIAL/PLATELET
Basophils Absolute: 0.1 10*3/uL (ref 0.0–0.1)
Basophils Relative: 0.9 % (ref 0.0–3.0)
Eosinophils Absolute: 0.2 10*3/uL (ref 0.0–0.7)
Eosinophils Relative: 2.4 % (ref 0.0–5.0)
HCT: 48.8 % (ref 39.0–52.0)
Hemoglobin: 16.7 g/dL (ref 13.0–17.0)
Lymphocytes Relative: 23.4 % (ref 12.0–46.0)
Lymphs Abs: 2 10*3/uL (ref 0.7–4.0)
MCHC: 34.2 g/dL (ref 30.0–36.0)
MCV: 94.3 fl (ref 78.0–100.0)
Monocytes Absolute: 0.5 10*3/uL (ref 0.1–1.0)
Monocytes Relative: 5.8 % (ref 3.0–12.0)
Neutro Abs: 5.9 10*3/uL (ref 1.4–7.7)
Neutrophils Relative %: 67.5 % (ref 43.0–77.0)
Platelets: 175 10*3/uL (ref 150.0–400.0)
RBC: 5.17 Mil/uL (ref 4.22–5.81)
RDW: 13 % (ref 11.5–15.5)
WBC: 8.8 10*3/uL (ref 4.0–10.5)

## 2021-08-20 LAB — COMPREHENSIVE METABOLIC PANEL
ALT: 46 U/L (ref 0–53)
AST: 25 U/L (ref 0–37)
Albumin: 4.6 g/dL (ref 3.5–5.2)
Alkaline Phosphatase: 54 U/L (ref 39–117)
BUN: 17 mg/dL (ref 6–23)
CO2: 29 mEq/L (ref 19–32)
Calcium: 10 mg/dL (ref 8.4–10.5)
Chloride: 104 mEq/L (ref 96–112)
Creatinine, Ser: 0.9 mg/dL (ref 0.40–1.50)
GFR: 96.52 mL/min (ref >60.00)
Glucose, Bld: 111 mg/dL — ABNORMAL HIGH (ref 70–99)
Potassium: 3.9 mEq/L (ref 3.5–5.1)
Sodium: 141 mEq/L (ref 135–145)
Total Bilirubin: 0.6 mg/dL (ref 0.2–1.2)
Total Protein: 7.3 g/dL (ref 6.0–8.3)

## 2021-08-20 LAB — SEDIMENTATION RATE: Sed Rate: 7 mm/hr (ref 0–20)

## 2021-08-20 LAB — LIPASE: Lipase: 54 U/L (ref 11.0–59.0)

## 2021-08-20 LAB — C-REACTIVE PROTEIN: CRP: <1 mg/dL (ref 0.5–20.0)

## 2021-08-20 MED ORDER — HYOSCYAMINE SULFATE 0.125 MG PO TABS: 0.1250 mg | ORAL_TABLET | Freq: Four times a day (QID) | ORAL | 0 refills | Status: AC | PRN

## 2021-08-20 MED ORDER — DICYCLOMINE HCL 20 MG PO TABS: 20.0000 mg | ORAL_TABLET | Freq: Three times a day (TID) | ORAL | 0 refills | Status: AC | PRN

## 2021-08-20 MED ORDER — NA SULFATE-K SULFATE-MG SULF 17.5-3.13-1.6 GM/177ML PO SOLN: 1.0000 | ORAL | 0 refills | Status: DC

## 2021-08-20 NOTE — Progress Notes (Signed)
Addendum: Reviewed and agree with assessment and management plan. Allysia Ingles M, MD  

## 2021-08-20 NOTE — Patient Instructions (Addendum)
If you are age 55 or younger, your body mass index should be between 19-25. Your Body mass index is 27.53 kg/m?Marland Kitchen If this is out of the aformentioned range listed, please consider follow up with your Primary Care Provider.  ?________________________________________________________ ? ?The  GI providers would like to encourage you to use [facility] to communicate with providers for non-urgent requests or questions.  Due to long hold times on the telephone, sending your provider a message by [facility] may be a faster and more efficient way to get a response.  Please allow 48 business hours for a response.  Please remember that this is for non-urgent requests.  ?_______________________________________________________ ?  ?Your provider has requested that you go to the basement level for lab work before leaving today. Press "B" on the elevator. The lab is located at the first door on the left as you exit the elevator. ? ?You have been scheduled for a colonoscopy. Please follow written instructions given to you at your visit today.  ?Please pick up your prep supplies at the pharmacy within the next 1-3 days. ?If you use inhalers (even only as needed), please bring them with you on the day of your procedure. ? ?Due to recent changes in healthcare laws, you may see the results of your imaging and laboratory studies on MyChart before your provider has had a chance to review them.  We understand that in some cases there may be results that are confusing or concerning to you. Not all laboratory results come back in the same time frame and the provider may be waiting for multiple results in order to interpret others.  Please give [place] 48 hours in order for your provider to thoroughly review all the results before contacting the office for clarification of your results.  ? ?Recommend starting on a fiber supplement, can try metamucil first but if this causes gas/bloating switch to benefiber ?Take with fiber with with a full 8 oz  glass of water once a day. This can take 1 month to start helping, so try for at least one month.  ?Recommend increasing water and activity.  ?Get a squatty potty to use at home or try a stool, goal is to get your knees above your hips during a bowel movement. ? ?Can try IBGard for AB pain rather than dycyclomine or levsin.  ? ?Miralax is an osmotic laxative.  ?It only brings more water into the stool.  ?This is safe to take daily.  ?Can take up to 17 gram of miralax twice a day.  ?Mix with juice or coffee.  ?Start 1 capful at night for 3-4 days and reassess your response in 3-4 days.  ?You can increase and decrease the dose based on your response.  ?Remember, it can take up to 3-4 days to take effect OR for the effects to wear off.  ? ?I often pair this with benefiber in the morning to help assure the stool is not too loose.  ? ? ?- Drink at least 64-80 ounces of water/liquid per day. ?- Establish a time to try to move your bowels every day.  For many people, this is after a cup of coffee or after a meal such as breakfast. ?- Sit all of the way back on the toilet keeping your back fairly straight and while sitting up, try to rest the tops of your forearms on your upper thighs.   ?- Raising your feet with a step stool/squatty potty can be helpful to improve the angle  that allows your stool to pass through the rectum. ?- Relax the rectum feeling it bulge toward the toilet water.  If you feel your rectum raising toward your body, you are contracting rather than relaxing. ?- Breathe in and slowly exhale. "Belly breath" by expanding your belly towards your belly button. Keep belly expanded as you gently direct pressure down and back to the anus.  A low pitched GRRR sound can assist with increasing intra-abdominal pressure.  ?- Repeat 3-4 times. If unsuccessful, contract the pelvic floor to restore normal tone and get off the toilet.  Avoid excessive straining. ?- To reduce excessive wiping by teaching your anus to  normally contract, place hands on outer aspect of knees and resist knee movement outward.  Hold 5-10 second then place hands just inside of knees and resist inward movement of knees.  Hold 5 seconds.  Repeat a few times each way. ? ?Go to the ER if unable to pass gas, severe AB pain, unable to hold down food, any shortness of breath of chest pain.  ? ?Constipation, Adult ?Constipation is when a person has fewer than three bowel movements in a week, has difficulty having a bowel movement, or has stools (feces) that are dry, hard, or larger than normal. Constipation may be caused by an underlying condition. It may become worse with age if a person takes certain medicines and does not take in enough fluids. ?Follow these instructions at home: ?Eating and drinking ? ?Eat foods that have a lot of fiber, such as beans, whole grains, and fresh fruits and vegetables. ?Limit foods that are low in fiber and high in fat and processed sugars, such as fried or sweet foods. These include french fries, hamburgers, cookies, candies, and soda. ?Drink enough fluid to keep your urine pale yellow. ?General instructions ?Exercise regularly or as told by your health care provider. Try to do 150 minutes of moderate exercise each week. ?Use the bathroom when you have the urge to go. Do not hold it in. ?Take over-the-counter and prescription medicines only as told by your health care provider. This includes any fiber supplements. ?During bowel movements: ?Practice deep breathing while relaxing the lower abdomen. ?Practice pelvic floor relaxation. ?Watch your condition for any changes. Let your health care provider know about them. ?Keep all follow-up visits as told by your health care provider. This is important. ?Contact a health care provider if: ?You have pain that gets worse. ?You have a fever. ?You do not have a bowel movement after 4 days. ?You vomit. ?You are not hungry or you lose weight. ?You are bleeding from the opening between  the buttocks (anus). ?You have thin, pencil-like stools. ?Get help right away if: ?You have a fever and your symptoms suddenly get worse. ?You leak stool or have blood in your stool. ?Your abdomen is bloated. ?You have severe pain in your abdomen. ?You feel dizzy or you faint. ?Summary ?Constipation is when a person has fewer than three bowel movements in a week, has difficulty having a bowel movement, or has stools (feces) that are dry, hard, or larger than normal. ?Eat foods that have a lot of fiber, such as beans, whole grains, and fresh fruits and vegetables. ?Drink enough fluid to keep your urine pale yellow. ?Take over-the-counter and prescription medicines only as told by your health care provider. This includes any fiber supplements. ?This information is not intended to replace advice given to you by your health care provider. Make sure you discuss any questions you  have with your health care provider. ?Document Revised: 02/14/2019 Document Reviewed: 02/14/2019 ?Elsevier Patient Education ? [place]. ? ?Thank you for entrusting me with your care and choosing [facility]. ? ?Vicie Mutters, PA-C ? ? ?

## 2021-08-27 ENCOUNTER — Encounter: Payer: Self-pay | Admitting: Internal Medicine

## 2021-09-01 DIAGNOSIS — E119 Type 2 diabetes mellitus without complications: Secondary | ICD-10-CM | POA: Diagnosis not present

## 2021-09-03 ENCOUNTER — Ambulatory Visit (AMBULATORY_SURGERY_CENTER): Payer: BC Managed Care – PPO | Admitting: Internal Medicine

## 2021-09-03 ENCOUNTER — Encounter: Payer: Self-pay | Admitting: Internal Medicine

## 2021-09-03 VITALS — BP 112/75 | HR 55 | Temp 97.3°F | Resp 18 | Ht 72.0 in | Wt 208.0 lb

## 2021-09-03 DIAGNOSIS — D123 Benign neoplasm of transverse colon: Secondary | ICD-10-CM

## 2021-09-03 DIAGNOSIS — D122 Benign neoplasm of ascending colon: Secondary | ICD-10-CM

## 2021-09-03 DIAGNOSIS — Z1211 Encounter for screening for malignant neoplasm of colon: Secondary | ICD-10-CM | POA: Diagnosis not present

## 2021-09-03 MED ORDER — METRONIDAZOLE 250 MG PO TABS: 250.0000 mg | ORAL_TABLET | Freq: Three times a day (TID) | ORAL | 0 refills | Status: DC

## 2021-09-03 MED ORDER — CIPROFLOXACIN HCL 500 MG PO TABS: 500.0000 mg | ORAL_TABLET | Freq: Two times a day (BID) | ORAL | 0 refills | Status: AC

## 2021-09-03 MED ORDER — CIPROFLOXACIN HCL 500 MG PO TABS: 500.0000 mg | ORAL_TABLET | Freq: Two times a day (BID) | ORAL | 0 refills | Status: DC

## 2021-09-03 MED ORDER — SODIUM CHLORIDE 0.9 % IV SOLN: 500.0000 mL | Freq: Once | INTRAVENOUS | Status: DC

## 2021-09-03 MED ORDER — METRONIDAZOLE 250 MG PO TABS: 250.0000 mg | ORAL_TABLET | Freq: Three times a day (TID) | ORAL | 0 refills | Status: AC

## 2021-09-03 NOTE — Patient Instructions (Signed)
Handouts given for polyps and diverticulosis.  Await pathology results.  Resume previous diet and continue present medications.  YOU HAD AN ENDOSCOPIC PROCEDURE TODAY AT [facility]:   Refer to the procedure report that was given to you for any specific questions about what was found during the examination.  If the procedure report does not answer your questions, please call your gastroenterologist to clarify.  If you requested that your care partner not be given the details of your procedure findings, then the procedure report has been included in a sealed envelope for you to review at your convenience later.  YOU SHOULD EXPECT: Some feelings of bloating in the abdomen. Passage of more gas than usual.  Walking can help get rid of the air that was put into your GI tract during the procedure and reduce the bloating. If you had a lower endoscopy (such as a colonoscopy or flexible sigmoidoscopy) you may notice spotting of blood in your stool or on the toilet paper. If you underwent a bowel prep for your procedure, you may not have a normal bowel movement for a few days.  Please Note:  You might notice some irritation and congestion in your nose or some drainage.  This is from the oxygen used during your procedure.  There is no need for concern and it should clear up in a day or so.  SYMPTOMS TO REPORT IMMEDIATELY:  Following lower endoscopy (colonoscopy or flexible sigmoidoscopy):  Excessive amounts of blood in the stool  Significant tenderness or worsening of abdominal pains  Swelling of the abdomen that is new, acute  Fever of 100F or higher   For urgent or emergent issues, a gastroenterologist can be reached at any hour by calling [phone]. Do not use MyChart messaging for urgent concerns.    DIET:  We do recommend a small meal at first, but then you may proceed to your regular diet.  Drink plenty of fluids but you should avoid alcoholic beverages for 24  hours.  ACTIVITY:  You should plan to take it easy for the rest of today and you should NOT DRIVE or use heavy machinery until tomorrow (because of the sedation medicines used during the test).    FOLLOW UP: Our staff will call the number listed on your records 48-72 hours following your procedure to check on you and address any questions or concerns that you may have regarding the information given to you following your procedure. If we do not reach you, we will leave a message.  We will attempt to reach you two times.  During this call, we will ask if you have developed any symptoms of COVID 19. If you develop any symptoms (ie: fever, flu-like symptoms, shortness of breath, cough etc.) before then, please call [phone].  If you test positive for Covid 19 in the 2 weeks post procedure, please call and report this information to [place].    If any biopsies were taken you will be contacted by phone or by letter within the next 1-3 weeks.  Please call us at [phone] if you have not heard about the biopsies in 3 weeks.    SIGNATURES/CONFIDENTIALITY: You and/or your care partner have signed paperwork which will be entered into your electronic medical record.  These signatures attest to the fact that that the information above on your After Visit Summary has been reviewed and is understood.  Full responsibility of the confidentiality of this discharge information lies with you and/or your care-partner.

## 2021-09-03 NOTE — Progress Notes (Signed)
Called to room to assist during endoscopic procedure.  Patient ID and intended procedure confirmed with present staff. Received instructions for my participation in the procedure from the performing physician.  

## 2021-09-03 NOTE — Progress Notes (Signed)
PT taken to PACU. Monitors in place. VSS. Report given to RN. 

## 2021-09-03 NOTE — Progress Notes (Signed)
See office note dated 08/20/2021 for details patient remains appropriate for colonoscopy in the [facility] today

## 2021-09-03 NOTE — Op Note (Signed)
[place] Patient Name: Steve Ritter Procedure Date: 09/03/2021 3:07 PM MRN: [id number] Endoscopist: [name] , MD Age: 56 Referring MD:  Date of Birth: 1965-06-06 Gender: Male Account #: [id number] Procedure:                Colonoscopy Indications:              Screening for colorectal malignant neoplasm, Last                            colonoscopy: 2013 (ischemic colitis on this exam) Medicines:                Monitored Anesthesia Care Procedure:                Pre-Anesthesia Assessment:                           - Prior to the procedure, a History and Physical                            was performed, and patient medications and                            allergies were reviewed. The patient's tolerance of                            previous anesthesia was also reviewed. The risks                            and benefits of the procedure and the sedation                            options and risks were discussed with the patient.                            All questions were answered, and informed consent                            was obtained. Prior Anticoagulants: The patient has                            taken no previous anticoagulant or antiplatelet                            agents. ASA Grade Assessment: II - A patient with                            mild systemic disease. After reviewing the risks                            and benefits, the patient was deemed in                            satisfactory condition to undergo the procedure.  After obtaining informed consent, the colonoscope                            was passed under direct vision. Throughout the                            procedure, the patient's blood pressure, pulse, and                            oxygen saturations were monitored continuously. The                            Colonoscope was introduced through the anus and                            advanced to the  cecum, identified by appendiceal                            orifice and ileocecal valve. The colonoscopy was                            performed without difficulty. The patient tolerated                            the procedure well. The quality of the bowel                            preparation was good. The ileocecal valve,                            appendiceal orifice, and rectum were photographed. Scope In: 3:19:46 PM Scope Out: 3:42:03 PM Scope Withdrawal Time: 0 hours 16 minutes 30 seconds  Total Procedure Duration: 0 hours 22 minutes 17 seconds  Findings:                 The digital rectal exam was normal.                           A 6 mm polyp was found in the ascending colon. The                            polyp was sessile. The polyp was removed with a                            cold snare. Resection and retrieval were complete.                           Three sessile polyps were found in the hepatic                            flexure. The polyps were 5 to 10 mm in size. These  polyps were removed with a cold snare. Resection                            and retrieval were complete.                           Multiple small-mouthed diverticula were found in                            the sigmoid colon.                           Internal hemorrhoids were found during                            retroflexion. The hemorrhoids were medium-sized. Complications:            No immediate complications. Estimated Blood Loss:     Estimated blood loss was minimal. Impression:               - One 6 mm polyp in the ascending colon, removed                            with a cold snare. Resected and retrieved.                           - Three 5 to 10 mm polyps at the hepatic flexure,                            removed with a cold snare. Resected and retrieved.                           - Diverticulosis in the sigmoid colon.                           - Internal  hemorrhoids. Recommendation:           - Patient has a contact number available for                            emergencies. The signs and symptoms of potential                            delayed complications were discussed with the                            patient. Return to normal activities tomorrow.                            Written discharge instructions were provided to the                            patient.                           - Resume previous diet.                           -  Continue present medications.                           - Await pathology results.                           - Repeat colonoscopy is recommended for                            surveillance. The colonoscopy date will be                            determined after pathology results from today's                            exam become available for review. [name], MD 09/03/2021 3:45:28 PM This report has been signed electronically.

## 2021-09-11 ENCOUNTER — Encounter: Payer: Self-pay | Admitting: Internal Medicine

## 2021-10-20 DIAGNOSIS — R109 Unspecified abdominal pain: Secondary | ICD-10-CM | POA: Diagnosis not present

## 2021-10-20 DIAGNOSIS — E1169 Type 2 diabetes mellitus with other specified complication: Secondary | ICD-10-CM | POA: Diagnosis not present

## 2021-11-12 ENCOUNTER — Telehealth: Payer: Self-pay | Admitting: Internal Medicine

## 2021-11-12 NOTE — Telephone Encounter (Signed)
The was addressed in previous message. Pt was told to check with PCP, or to go to urgent care of the ER as we have no sooner appts available. Pt on cancellation list.

## 2021-11-12 NOTE — Telephone Encounter (Signed)
Patient had colonoscopy with Dr. [name] 5/25.  He has been experiencing sweating, dizzines, and severe abdominal pain -- "feels like a cross between nausea and being very hungry."  He said the pills prescribed for him were not working.  He has an appointment scheduled with Dora Sims but wanted to know if there was anything he could do in the interim.  Please call patient and advise.  Thank you.

## 2021-11-12 NOTE — Telephone Encounter (Signed)
Spoke with pt and let him know we do not have any sooner appts. Pt reports he is in excruciating pain and needs to be seen. Discussed with him if he was having that much pain he should be seen at urgent care or the ER. Pt verbalized understanding.

## 2021-11-12 NOTE — Telephone Encounter (Signed)
Steve Ritter from Dr [name] requesting we work patient in sooner or ask one of our providers to see if something can be called in for him to help him until the follow up next week. He told them we asked him to reach out to primary care but they have nothing else to offer. I advised Steve Ritter that triage suggested ER but she tell me, she doesn't want to do that to him.

## 2021-11-17 NOTE — Progress Notes (Unsigned)
11/18/2021 DEERIC CRUISE [id number] 11-24-1965  Referring provider: Velna Hatchet, MD Primary GI doctor: Dr. [name]  ASSESSMENT AND PLAN:  Assessment: 56 y.o. male here for assessment of the following: 1. Chronic LLQ pain   2. RUQ pain   3. Anxiety state   4. History of adenomatous polyp of colon   5. Nausea without vomiting    08/11/2021 CT AB and pelvis with contrast for hematuria and AB pain, 5 mm right renal calculus, fatty liver, enlarged prostate.  09/03/2021 colonoscopy with Dr. [name] for screening purposes 6 mm polyp in ascending colon, 3 polyps 5 to 10 mm hepatic flexure, diverticulosis in sigmoid, internal hemorrhoids.  Polyps were tubular adenomas, recall 3 years.  (08/2024) Patient also has history of lumbar surgery, chronic pain. Patient does smoke marijuana several times a week.  Patient continues complain of left lower quadrant abdominal pain,/left flank pain.   Had CT abdomen pelvis right renal stone otherwise unremarkable.  Recent colonoscopy showed diverticulosis and multiple polyps. Patient having nausea with the discomfort as well.  Some right upper quadrant pain. No fevers no chills, with symptoms  Plan: Consider repeat CT to evaluate for diverticulitis.  Patient would like to wait till labs return.  If there is any inflammation or elevated white blood cell count will repeat CT to rule out diverticulitis in the interim. Patient has some right upper quadrant pain to palpation, can consider right upper quadrant ultrasound to evaluate gallbladder further. Patient advised to stop smoking marijuana for possible cannabinoid hyperemesis syndrome.  Can try lidocaine patches in the meantime. Patient also states can be worse with movement later on in the day, follow-up with primary care/orthopedic, has had multiple lumbar surgeries, some discomfort with rotation on exam. Patient states dicyclomine/hyoscyamine is not helping, was requesting a pain medication.  Told patient  tried lidocaine patches and can follow-up with pain management/orthopedic, can consider trial of amitriptyline to help with nausea and abdominal pain Patient is also back on Ozempic, question how much this is actually contributing to abdominal pain, if possible sugars consider extended trial off.  Orders Placed This Encounter  Procedures   CBC with Differential/Platelet   Comprehensive metabolic panel   H. pylori antibody, IgG   High sensitivity CRP   Sedimentation rate    Meds ordered this encounter  Medications   ondansetron (ZOFRAN) 4 MG tablet    Sig: Take 1 tablet (4 mg total) by mouth every 8 (eight) hours as needed for nausea or vomiting.    Dispense:  20 tablet    Refill:  0    History of Present Illness:  56 y.o. male  with a past medical history of hypertension, hyperlipidemia, sleep apnea, diabetes type 2, history of pancreatitis 2017  and others listed below, returns to clinic today for evaluation of abdominal pain.  08/11/2021 CT AB and pelvis with contrast for hematuria and AB pain, 5 mm right renal calculus, fatty liver, enlarged prostate.  Patient seen in the office 08/20/2021 for left lower quadrant abdominal pain, set up for colonoscopy.   Was given dicyclomine and Levsin to take as needed.   Added on MiraLAX/fiber for chronic idiopathic constipation thought to be worse with Ozempic patient does have ibuprofen listed on medication list patient is on Prilosec 40 mg once daily father with history of heart disease, patient does smoke 09/03/2021 colonoscopy with Dr. [name] for screening purposes 6 mm polyp in ascending colon, 3 polyps 5 to 10 mm hepatic flexure, diverticulosis in sigmoid, internal  hemorrhoids.  Polyps were tubular adenomas, recall 3 years.  (08/2024) 11/12/2021 called back to the office complaining of severe abdominal pain with sweating and dizziness, feels like a cross between nausea and being very hungry.  States medications were not working advised to go  to ER or urgent care.  Having severe LLQ AB or left flank pain, felt like he was having a needle stuck in his AB. Normally constant, has been better last few days.  States better in morning, will get biscuit on way to work, and 1030-11 AM will start with the pain, has tried bland food without help.  Has not improved since colonoscopy or with BM purge.  Can feel faint/light headed with standing too quickly. Requesting pain medications.  He has nausea with pain. He has some GERD, has doubled on his prilosec. No dysphagia. Has increased gas, no AB bloating.  Denies fever, chills. States he is hot natured.  He denies SOB, CP.  He has had diarrhea all last week for 2-3 days, normally once a day, formed, no straining. No melena.  He is back on the ozempic. No ETOH, smoker marijuana several times a week at least 2-3. No NSAIDS.   08/11/2021 CT abdomen pelvis with contrast for left upper and left lower quadrant abdominal pain with hematuria.  Moderate diffuse hepatic steatosis unremarkable gallbladder without ductal dilatation.  No pancreatic masses or inflammatory changes.   5 mm calculus right kidney, no obstruction or inflammatory process 10/06/2015 CT abdomen pelvis with and without contrast pancreatic protocol showed hepatic steatosis, bilateral kidney stones, significant improvement inflammatory change in around duodenal C-loop but no definite pancreatic mass, small soft tissue nodule likely sequelae of inflammatory process but recommended follow-up CT in 3 months.  I do not see where this was done. 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 Georjean Mode Showed hyperplastic polyp, ulcer negative for malignancy.   Current Medications:   Current Outpatient Medications (Endocrine & Metabolic):    JARDIANCE 25 MG TABS  tablet, Take 25 mg by mouth daily.   metFORMIN (GLUCOPHAGE-XR) 500 MG 24 hr tablet, Take 1,000 mg by mouth 2 (two) times daily.  Current Outpatient Medications (Cardiovascular):    amLODipine (NORVASC) 5 MG tablet, Take 5 mg by mouth daily.   fenofibrate (TRICOR) 145 MG tablet, SMARTSIG:1 Tablet(s) By Mouth Every Evening   sildenafil (REVATIO) 20 MG tablet, Take by mouth.  Current Outpatient Medications (Respiratory):    Oxymetazoline HCl (NASAL SPRAY) 0.05 % SOLN, Place into the nose.  Current Outpatient Medications (Analgesics):    aspirin EC 81 MG tablet, Take 81 mg by mouth.   ibuprofen (ADVIL) 800 MG tablet, Take 1 tablet (800 mg total) by mouth every 8 (eight) hours as needed.   Current Outpatient Medications (Other):    blood glucose meter kit and supplies KIT, Dispense based on patient and insurance preference. Use up to four times daily as directed. (FOR ICD-9 250.00, 250.01).   diazepam (VALIUM) 5 MG tablet, Take 5 mg by mouth daily as needed.   dicyclomine (BENTYL) 20 MG tablet, Take 1 tablet (20 mg total) by mouth 3 (three) times daily as needed for spasms (this is longer aciting, take to prevent).   glucose blood test strip, Use to test blood sugar up to 4 times daily.   hyoscyamine (LEVSIN) 0.125 MG tablet, Take 1 tablet (0.125 mg total)  by mouth every 6 (six) hours as needed for cramping.   omeprazole (PRILOSEC) 40 MG capsule, Take 40 mg by mouth daily.   ondansetron (ZOFRAN) 4 MG tablet, Take 1 tablet (4 mg total) by mouth every 8 (eight) hours as needed for nausea or vomiting.   Polyethylene Glycol 3350 GRAN, Take by mouth.  Surgical History:  He  has a past surgical history that includes Bilateral carpal tunnel release (Bilateral); Esophagogastroduodenoscopy (N/A, 08/20/2015); and Colonoscopy (06/2011). Family History:  His family history includes Diabetes in his father; Heart disease in his father; Hypertension in his father and mother; Mental retardation in his father;  Stroke in his father. Social History:   reports that he has been smoking cigarettes. He has never used smokeless tobacco. He reports current alcohol use. He reports that he does not use drugs.  Current Medications, Allergies, Past Medical History, Past Surgical History, Family History and Social History were reviewed in [employer] record.  Physical Exam: BP 138/86   Pulse 65   Ht 6' (1.829 m)   Wt 202 lb (91.6 kg)   BMI 27.40 kg/m  General:   Pleasant, well developed male in no acute distress Heart : Regular rate and rhythm; no murmurs Pulm: Clear anteriorly; no wheezing Abdomen:  Soft, Non-distended AB, Active bowel sounds. mild tenderness in the RUQ and in the LLQ. With guarding and Without rebound, No organomegaly appreciated. Rectal: Not evaluated Extremities:  without  edema. Neurologic:  Alert and  oriented x4;  No focal deficits.  Psych:  Cooperative. Normal mood and affect.   Vladimir Crofts, PA-C 11/18/21

## 2021-11-18 ENCOUNTER — Ambulatory Visit: Payer: BC Managed Care – PPO | Admitting: Physician Assistant

## 2021-11-18 ENCOUNTER — Encounter: Payer: Self-pay | Admitting: Physician Assistant

## 2021-11-18 ENCOUNTER — Other Ambulatory Visit (INDEPENDENT_AMBULATORY_CARE_PROVIDER_SITE_OTHER): Payer: BC Managed Care – PPO

## 2021-11-18 VITALS — BP 138/86 | HR 65 | Ht 72.0 in | Wt 202.0 lb

## 2021-11-18 DIAGNOSIS — G8929 Other chronic pain: Secondary | ICD-10-CM

## 2021-11-18 DIAGNOSIS — R1032 Left lower quadrant pain: Secondary | ICD-10-CM | POA: Diagnosis not present

## 2021-11-18 DIAGNOSIS — R1011 Right upper quadrant pain: Secondary | ICD-10-CM

## 2021-11-18 DIAGNOSIS — Z86010 Personal history of colonic polyps: Secondary | ICD-10-CM | POA: Diagnosis not present

## 2021-11-18 DIAGNOSIS — F411 Generalized anxiety disorder: Secondary | ICD-10-CM | POA: Diagnosis not present

## 2021-11-18 DIAGNOSIS — R110 Nausea: Secondary | ICD-10-CM

## 2021-11-18 LAB — CBC WITH DIFFERENTIAL/PLATELET
Basophils Absolute: 0 10*3/uL (ref 0.0–0.1)
Basophils Relative: 0.6 % (ref 0.0–3.0)
Eosinophils Absolute: 0.2 10*3/uL (ref 0.0–0.7)
Eosinophils Relative: 2.6 % (ref 0.0–5.0)
HCT: 44.8 % (ref 39.0–52.0)
Hemoglobin: 15.5 g/dL (ref 13.0–17.0)
Lymphocytes Relative: 25.7 % (ref 12.0–46.0)
Lymphs Abs: 2 10*3/uL (ref 0.7–4.0)
MCHC: 34.6 g/dL (ref 30.0–36.0)
MCV: 94.7 fl (ref 78.0–100.0)
Monocytes Absolute: 0.5 10*3/uL (ref 0.1–1.0)
Monocytes Relative: 5.8 % (ref 3.0–12.0)
Neutro Abs: 5.2 10*3/uL (ref 1.4–7.7)
Neutrophils Relative %: 65.3 % (ref 43.0–77.0)
Platelets: 204 10*3/uL (ref 150.0–400.0)
RBC: 4.73 Mil/uL (ref 4.22–5.81)
RDW: 12.8 % (ref 11.5–15.5)
WBC: 7.9 10*3/uL (ref 4.0–10.5)

## 2021-11-18 LAB — COMPREHENSIVE METABOLIC PANEL
ALT: 26 U/L (ref 0–53)
AST: 19 U/L (ref 0–37)
Albumin: 4.3 g/dL (ref 3.5–5.2)
Alkaline Phosphatase: 46 U/L (ref 39–117)
BUN: 12 mg/dL (ref 6–23)
CO2: 28 mEq/L (ref 19–32)
Calcium: 9.6 mg/dL (ref 8.4–10.5)
Chloride: 105 mEq/L (ref 96–112)
Creatinine, Ser: 0.88 mg/dL (ref 0.40–1.50)
GFR: 97.01 mL/min (ref >60.00)
Glucose, Bld: 104 mg/dL — ABNORMAL HIGH (ref 70–99)
Potassium: 4 mEq/L (ref 3.5–5.1)
Sodium: 142 mEq/L (ref 135–145)
Total Bilirubin: 0.5 mg/dL (ref 0.2–1.2)
Total Protein: 6.7 g/dL (ref 6.0–8.3)

## 2021-11-18 LAB — HIGH SENSITIVITY CRP: CRP, High Sensitivity: 3.46 mg/L (ref 0.000–5.000)

## 2021-11-18 LAB — SEDIMENTATION RATE: Sed Rate: 7 mm/hr (ref 0–20)

## 2021-11-18 LAB — H. PYLORI ANTIBODY, IGG: H Pylori IgG: POSITIVE — AB

## 2021-11-18 MED ORDER — ONDANSETRON HCL 4 MG PO TABS: 4.0000 mg | ORAL_TABLET | Freq: Three times a day (TID) | ORAL | 0 refills | Status: AC | PRN

## 2021-11-18 NOTE — Patient Instructions (Addendum)
Your provider has requested that you go to the basement level for lab work before leaving today. Press "B" on the elevator. The lab is located at the first door on the left as you exit the elevator.  Lidocaine patches please try these Stop ozepmic Will get labs to evaluate May get [place] versus CT pending labs to evaluate for diverticulitis versus [place] for gallbladder and RUQ pain.   We have given you a work excuse today   Stop smoking marijuana, can take 6-9 months.   Please take your proton pump inhibitor medication, twice a day  Please take this medication 30 minutes to 1 hour before meals- this makes it more effective.  Avoid spicy and acidic foods Avoid fatty foods Limit your intake of coffee, tea, alcohol, and carbonated drinks Work to maintain a healthy weight Keep the head of the bed elevated at least 3 inches with blocks or a wedge pillow if you are having any nighttime symptoms Stay upright for 2 hours after eating Avoid meals and snacks three to four hours before bedtime Stop smoking  Check with PCP for back pain to see if this can be contributing  Please go to the ER if you have any severe AB pain, unable to hold down food/water, blood in stool or vomit, chest pain, shortness of breath, or any worsening symptoms.    Due to recent changes in healthcare laws, you may see the results of your imaging and laboratory studies on MyChart before your provider has had a chance to review them.  We understand that in some cases there may be results that are confusing or concerning to you. Not all laboratory results come back in the same time frame and the provider may be waiting for multiple results in order to interpret others.  Please give [place] 48 hours in order for your provider to thoroughly review all the results before contacting the office for clarification of your results.    I appreciate the  opportunity to care for you  Thank You   [facility]

## 2021-11-19 ENCOUNTER — Other Ambulatory Visit: Payer: Self-pay

## 2021-11-19 DIAGNOSIS — R1011 Right upper quadrant pain: Secondary | ICD-10-CM

## 2021-11-19 DIAGNOSIS — R768 Other specified abnormal immunological findings in serum: Secondary | ICD-10-CM

## 2021-11-19 NOTE — Progress Notes (Signed)
Addendum: Reviewed and agree with assessment and management plan. Enzo Treu M, MD  

## 2021-12-04 ENCOUNTER — Other Ambulatory Visit: Payer: BC Managed Care – PPO

## 2021-12-10 ENCOUNTER — Other Ambulatory Visit: Payer: BC Managed Care – PPO

## 2021-12-10 DIAGNOSIS — R768 Other specified abnormal immunological findings in serum: Secondary | ICD-10-CM

## 2021-12-10 DIAGNOSIS — K219 Gastro-esophageal reflux disease without esophagitis: Secondary | ICD-10-CM | POA: Diagnosis not present

## 2021-12-12 LAB — H. PYLORI ANTIGEN, STOOL: H pylori Ag, Stl: NEGATIVE

## 2022-01-12 DIAGNOSIS — E1169 Type 2 diabetes mellitus with other specified complication: Secondary | ICD-10-CM | POA: Diagnosis not present

## 2022-01-12 DIAGNOSIS — E785 Hyperlipidemia, unspecified: Secondary | ICD-10-CM | POA: Diagnosis not present

## 2022-01-14 DIAGNOSIS — E559 Vitamin D deficiency, unspecified: Secondary | ICD-10-CM | POA: Diagnosis not present

## 2022-01-14 DIAGNOSIS — Z125 Encounter for screening for malignant neoplasm of prostate: Secondary | ICD-10-CM | POA: Diagnosis not present

## 2022-01-14 DIAGNOSIS — E785 Hyperlipidemia, unspecified: Secondary | ICD-10-CM | POA: Diagnosis not present

## 2022-01-19 DIAGNOSIS — R82998 Other abnormal findings in urine: Secondary | ICD-10-CM | POA: Diagnosis not present

## 2022-01-19 DIAGNOSIS — E1169 Type 2 diabetes mellitus with other specified complication: Secondary | ICD-10-CM | POA: Diagnosis not present

## 2022-01-19 DIAGNOSIS — Z1339 Encounter for screening examination for other mental health and behavioral disorders: Secondary | ICD-10-CM | POA: Diagnosis not present

## 2022-01-19 DIAGNOSIS — Z0000 Encounter for general adult medical examination without abnormal findings: Secondary | ICD-10-CM | POA: Diagnosis not present

## 2022-01-19 DIAGNOSIS — Z1331 Encounter for screening for depression: Secondary | ICD-10-CM | POA: Diagnosis not present

## 2022-01-20 ENCOUNTER — Other Ambulatory Visit: Payer: Self-pay | Admitting: Internal Medicine

## 2022-01-20 DIAGNOSIS — F17200 Nicotine dependence, unspecified, uncomplicated: Secondary | ICD-10-CM

## 2022-02-03 ENCOUNTER — Other Ambulatory Visit: Payer: BC Managed Care – PPO

## 2022-02-12 ENCOUNTER — Encounter: Payer: Self-pay | Admitting: *Deleted

## 2022-02-16 ENCOUNTER — Ambulatory Visit: Payer: BC Managed Care – PPO | Admitting: Internal Medicine

## 2022-03-31 ENCOUNTER — Encounter: Payer: Self-pay | Admitting: *Deleted

## 2022-04-14 ENCOUNTER — Other Ambulatory Visit (HOSPITAL_COMMUNITY): Payer: Self-pay | Admitting: Internal Medicine

## 2022-04-14 ENCOUNTER — Ambulatory Visit (HOSPITAL_COMMUNITY)
Admission: RE | Admit: 2022-04-14 | Discharge: 2022-04-14 | Disposition: A | Discharge status: Home / Self Care | Payer: BC Managed Care – PPO | Source: Ambulatory Visit | Attending: Internal Medicine | Admitting: Internal Medicine

## 2022-04-14 DIAGNOSIS — R2689 Other abnormalities of gait and mobility: Secondary | ICD-10-CM | POA: Diagnosis not present

## 2022-04-14 DIAGNOSIS — W19XXXA Unspecified fall, initial encounter: Secondary | ICD-10-CM | POA: Insufficient documentation

## 2022-04-14 DIAGNOSIS — I10 Essential (primary) hypertension: Secondary | ICD-10-CM | POA: Diagnosis not present

## 2022-04-14 DIAGNOSIS — X58XXXA Exposure to other specified factors, initial encounter: Secondary | ICD-10-CM | POA: Insufficient documentation

## 2022-04-14 DIAGNOSIS — R260 Ataxic gait: Secondary | ICD-10-CM | POA: Diagnosis not present

## 2022-04-14 DIAGNOSIS — R109 Unspecified abdominal pain: Secondary | ICD-10-CM | POA: Diagnosis not present

## 2022-04-14 DIAGNOSIS — R42 Dizziness and giddiness: Secondary | ICD-10-CM | POA: Insufficient documentation

## 2022-04-14 DIAGNOSIS — E1169 Type 2 diabetes mellitus with other specified complication: Secondary | ICD-10-CM | POA: Diagnosis not present

## 2022-04-21 ENCOUNTER — Encounter: Payer: Self-pay | Admitting: Internal Medicine

## 2022-04-21 ENCOUNTER — Ambulatory Visit (INDEPENDENT_AMBULATORY_CARE_PROVIDER_SITE_OTHER): Payer: BC Managed Care – PPO | Admitting: Internal Medicine

## 2022-04-21 VITALS — BP 136/78 | HR 71 | Ht 72.0 in | Wt 192.1 lb

## 2022-04-21 DIAGNOSIS — K5730 Diverticulosis of large intestine without perforation or abscess without bleeding: Secondary | ICD-10-CM

## 2022-04-21 DIAGNOSIS — K219 Gastro-esophageal reflux disease without esophagitis: Secondary | ICD-10-CM | POA: Diagnosis not present

## 2022-04-21 DIAGNOSIS — R1032 Left lower quadrant pain: Secondary | ICD-10-CM

## 2022-04-21 DIAGNOSIS — Z86010 Personal history of colonic polyps: Secondary | ICD-10-CM

## 2022-04-21 NOTE — Patient Instructions (Addendum)
If you are age 56 or older, your body mass index should be between 23-30. Your Body mass index is 26.06 kg/m. If this is out of the aforementioned range listed, please consider follow up with your Primary Care Provider.  If you are age 21 or younger, your body mass index should be between 19-25. Your Body mass index is 26.06 kg/m. If this is out of the aformentioned range listed, please consider follow up with your Primary Care Provider.   ________________________________________________________  Steve Ritter have been scheduled for a CT scan of the abdomen and pelvis at [facility], 1st floor Radiology. You are scheduled on Wednesday, 04-28-22 at 9:30am. You should arrive 30 minutes prior to your appointment time for registration.  We are giving you 2 bottles of contrast today that you will need to drink before arriving for the exam. The solution may taste better if refrigerated so put them in the refrigerator when you get home, but do NOT add ice or any other liquid to this solution as that would dilute it. Shake well before drinking.   Please follow the written instructions below on the day of your exam:   1) Do not eat anything after 5:30am (4 hours prior to your test)   2) Drink 1 bottle of contrast @ 7:30am (2 hours prior to your exam)  Remember to shake well before drinking and do NOT pour over ice.     Drink 1 bottle of contrast @ 8:30am (1 hour prior to your exam)   You may take any medications as prescribed with a small amount of water, if necessary. If you take any of the following medications: METFORMIN, GLUCOPHAGE, GLUCOVANCE, AVANDAMET, RIOMET, FORTAMET, [place] MET, JANUMET, GLUMETZA or METAGLIP, you MAY be asked to HOLD this medication 48 hours AFTER the exam.   The purpose of you drinking the oral contrast is to aid in the visualization of your intestinal tract. The contrast solution may cause some diarrhea. Depending on your individual set of symptoms, you may also receive an  intravenous injection of x-ray contrast/dye. Plan on being at [facility] for 45 minutes or longer, depending on the type of exam you are having performed.   If you have any questions regarding your exam or if you need to reschedule, you may call [facility] at [phone] between the hours of 8:00 am and 5:00 pm, Monday-Friday.   ____________________________________________________________________  Continue omeprazole and Pepcid (famotidine).  You have been scheduled for a follow up appointment with Dr. [name] on Tuesday, 3-19 at 9:10am. Please arrive 10 minutes early for registration.   Thank you for entrusting me with your care and for choosing [facility], Dr. Zenovia Jarred

## 2022-04-21 NOTE — Progress Notes (Signed)
Subjective:    Patient ID: Steve Ritter, male    DOB: [birth year], 57 y.o.   MRN: [id number]  HPI [name] is a 57 year old male with a history of adenomatous polyps, sigmoid diverticulosis, fatty liver, GERD, diabetes on Ozempic, hypertension, hyperlipidemia and sleep apnea who is here for follow-up.  He is here alone today and was last seen on 11/18/2021 by Vicie Mutters, PA-C.  He reports that he continues to have intermittent though fairly consistent left lower quadrant pain.  This can be sharp and other times achy.  Does seem to be worse with movement and exacerbated by movement such as bending over.  Can be severe enough where he needs to leave work.  Levsin helps this pain but makes him sleepy so he can only take this at home.  Pain does not seem to relate to bowel movement.  He has 1-2 typically soft but mostly formed stools daily.  Does not have constipation.  No blood in stool.  Occasional fecal urgency.  For the most part reflux is controlled on omeprazole 40 mg daily.  He will occasionally have morning nausea and vomiting.  He is taking Ozempic and with this his A1c has improved and he is lost 35 pounds.  He has had chronic issues with lower back pain and prior back surgery in the lumbar spine.  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  Steroid injection was not helpful.  He has pending consultation with neurology for dizziness.   Review of Systems As per HPI, otherwise negative  Current Medications, Allergies, Past Medical History, Past Surgical History, Family History and Social History were reviewed in [employer] record.    Objective:   Physical Exam BP 136/78 (BP Location: Right Arm, Patient Position: Sitting, Cuff Size: Normal)   Pulse 71   Ht 6' (1.829 m)   Wt 192 lb 2 oz (87.1 kg)   SpO2 94%   BMI 26.06 kg/m  Gen: awake, alert, NAD HEENT: anicteric CV: RRR, no mrg Pulm: CTA b/l Abd: soft, mildly  tender in the left lower abdomen without rebound or guarding, nondistended, +BS throughout Ext: no c/c/e Neuro: nonfocal  H. pylori stool negative in August 2023     Latest Ref Rng & Units 11/18/2021   10:33 AM 08/20/2021   11:09 AM 12/02/2020   11:17 AM  CBC  WBC 4.0 - 10.5 K/uL 7.9  8.8  9.3   Hemoglobin 13.0 - 17.0 g/dL 15.5  16.7  17.0   Hematocrit 39.0 - 52.0 % 44.8  48.8  50.1   Platelets 150.0 - 400.0 K/uL 204.0  175.0  197    CMP     Component Value Date/Time   NA 142 11/18/2021 1033   NA 140 12/02/2020 1117   K 4.0 11/18/2021 1033   CL 105 11/18/2021 1033   CO2 28 11/18/2021 1033   GLUCOSE 104 (H) 11/18/2021 1033   BUN 12 11/18/2021 1033   BUN 21 12/02/2020 1117   CREATININE 0.88 11/18/2021 1033   CREATININE 0.86 08/26/2015 1904   CALCIUM 9.6 11/18/2021 1033   PROT 6.7 11/18/2021 1033   PROT 6.8 12/02/2020 1117   ALBUMIN 4.3 11/18/2021 1033   ALBUMIN 4.7 12/02/2020 1117   AST 19 11/18/2021 1033   ALT 26 11/18/2021 1033   ALKPHOS 46 11/18/2021 1033   BILITOT 0.5 11/18/2021 1033   BILITOT 0.5 12/02/2020 1117   GFRNONAA >60 08/21/2015 0437  GFRAA >60 08/21/2015 0437   CRP and ESR normal      Assessment & Plan:   57 year old male with a history of adenomatous polyps, sigmoid diverticulosis, fatty liver, GERD, diabetes on Ozempic, hypertension, hyperlipidemia and sleep apnea who is here for follow-up.  Chronic left lower quadrant pain/history of diverticulosis --etiology of pain unclear.  Gets better with an antispasmodic but yet does not seem to relate to bowel movement.  Recent colonoscopy.  Query musculoskeletal wall pain, hernia or radiating lumbar back pain.  Also history of kidney stone but not on the left.  Pain is mild to at times moderate but not debilitating. -- Repeat CT scan abdomen pelvis with contrast -- Follow-up with me in March -- Consider sports med referral if persistent and CT negative  2.  GERD --currently well-controlled on omeprazole 40  mg daily, continue current dose.  Primary care had recently added famotidine 20 mg in the evening for breakthrough heartburn which she plans to start soon.  3.  History of adenomatous colon polyps --surveillance colonoscopy recommended in June 2026  30 minutes total spent today including patient facing time, coordination of care, reviewing medical history/procedures/pertinent radiology studies, and documentation of the encounter.

## 2022-04-28 ENCOUNTER — Ambulatory Visit (HOSPITAL_COMMUNITY)
Admission: RE | Admit: 2022-04-28 | Discharge: 2022-04-28 | Disposition: A | Discharge status: Home / Self Care | Payer: BC Managed Care – PPO | Source: Ambulatory Visit | Attending: Internal Medicine | Admitting: Internal Medicine

## 2022-04-28 ENCOUNTER — Encounter (HOSPITAL_COMMUNITY): Payer: Self-pay

## 2022-04-28 DIAGNOSIS — I10 Essential (primary) hypertension: Secondary | ICD-10-CM | POA: Diagnosis not present

## 2022-04-28 DIAGNOSIS — R1032 Left lower quadrant pain: Secondary | ICD-10-CM | POA: Diagnosis not present

## 2022-04-28 DIAGNOSIS — Z86010 Personal history of colonic polyps: Secondary | ICD-10-CM | POA: Insufficient documentation

## 2022-04-28 DIAGNOSIS — I700 Atherosclerosis of aorta: Secondary | ICD-10-CM | POA: Diagnosis not present

## 2022-04-28 DIAGNOSIS — R109 Unspecified abdominal pain: Secondary | ICD-10-CM | POA: Diagnosis not present

## 2022-04-28 DIAGNOSIS — K219 Gastro-esophageal reflux disease without esophagitis: Secondary | ICD-10-CM | POA: Insufficient documentation

## 2022-04-28 DIAGNOSIS — K5730 Diverticulosis of large intestine without perforation or abscess without bleeding: Secondary | ICD-10-CM

## 2022-04-28 DIAGNOSIS — K760 Fatty (change of) liver, not elsewhere classified: Secondary | ICD-10-CM | POA: Diagnosis not present

## 2022-04-28 DIAGNOSIS — R1110 Vomiting, unspecified: Secondary | ICD-10-CM | POA: Diagnosis not present

## 2022-04-28 HISTORY — DX: Type 2 diabetes mellitus without complications: E11.9

## 2022-04-28 LAB — POCT I-STAT CREATININE: Creatinine, Ser: 0.7 mg/dL (ref 0.61–1.24)

## 2022-04-28 MED ORDER — IOHEXOL 300 MG/ML  SOLN
100.0000 mL | Freq: Once | INTRAMUSCULAR | Status: AC | PRN
  Administered 2022-04-28: 100 mL via INTRAVENOUS

## 2022-04-29 ENCOUNTER — Other Ambulatory Visit: Payer: Self-pay

## 2022-04-29 DIAGNOSIS — M7918 Myalgia, other site: Secondary | ICD-10-CM

## 2022-04-30 ENCOUNTER — Telehealth: Payer: Self-pay | Admitting: Internal Medicine

## 2022-04-30 NOTE — Telephone Encounter (Signed)
Reviewed CT Results with pt and he is aware that the results were sent via mychart, he had not seen them. He thinks he may be having pain from the kidney stone. Suggested he contact his PCP regarding that and if the pain is severe he should go to the ER. He verbalized understanding.

## 2022-04-30 NOTE — Telephone Encounter (Signed)
Patient called in regards to CT results, also wanted to know why Dr. [name] referred him to a Sports Medicine doctor. Please advise.

## 2022-05-03 ENCOUNTER — Other Ambulatory Visit (HOSPITAL_COMMUNITY): Payer: Self-pay | Admitting: Internal Medicine

## 2022-05-03 ENCOUNTER — Ambulatory Visit (HOSPITAL_COMMUNITY)
Admission: RE | Admit: 2022-05-03 | Discharge: 2022-05-03 | Disposition: A | Discharge status: Home / Self Care | Payer: BC Managed Care – PPO | Source: Ambulatory Visit | Attending: Internal Medicine | Admitting: Internal Medicine

## 2022-05-03 DIAGNOSIS — M48061 Spinal stenosis, lumbar region without neurogenic claudication: Secondary | ICD-10-CM | POA: Diagnosis not present

## 2022-05-03 DIAGNOSIS — M5416 Radiculopathy, lumbar region: Secondary | ICD-10-CM | POA: Diagnosis not present

## 2022-05-07 DIAGNOSIS — M4302 Spondylolysis, cervical region: Secondary | ICD-10-CM | POA: Diagnosis not present

## 2022-05-07 DIAGNOSIS — R269 Unspecified abnormalities of gait and mobility: Secondary | ICD-10-CM | POA: Diagnosis not present

## 2022-05-07 DIAGNOSIS — M542 Cervicalgia: Secondary | ICD-10-CM | POA: Diagnosis not present

## 2022-05-07 DIAGNOSIS — M5136 Other intervertebral disc degeneration, lumbar region: Secondary | ICD-10-CM | POA: Diagnosis not present

## 2022-05-11 ENCOUNTER — Other Ambulatory Visit: Payer: Self-pay | Admitting: Rehabilitation

## 2022-05-11 DIAGNOSIS — M4302 Spondylolysis, cervical region: Secondary | ICD-10-CM

## 2022-05-27 ENCOUNTER — Ambulatory Visit
Admission: RE | Admit: 2022-05-27 | Discharge: 2022-05-27 | Disposition: A | Discharge status: Home / Self Care | Payer: BC Managed Care – PPO | Source: Ambulatory Visit | Attending: Rehabilitation | Admitting: Rehabilitation

## 2022-05-27 DIAGNOSIS — M542 Cervicalgia: Secondary | ICD-10-CM | POA: Diagnosis not present

## 2022-05-27 DIAGNOSIS — M4312 Spondylolisthesis, cervical region: Secondary | ICD-10-CM | POA: Diagnosis not present

## 2022-05-27 DIAGNOSIS — M4302 Spondylolysis, cervical region: Secondary | ICD-10-CM

## 2022-05-27 DIAGNOSIS — R200 Anesthesia of skin: Secondary | ICD-10-CM | POA: Diagnosis not present

## 2022-05-27 DIAGNOSIS — M47812 Spondylosis without myelopathy or radiculopathy, cervical region: Secondary | ICD-10-CM | POA: Diagnosis not present

## 2022-06-04 ENCOUNTER — Encounter: Payer: Self-pay | Admitting: *Deleted

## 2022-06-07 NOTE — Progress Notes (Deleted)
[place] [address] [place] Phone: [phone] Subjective:    I'm seeing this patient by the request  of:  Velna Hatchet, MD  CC:   [id number]  Steve Ritter is a 57 y.o. male coming in with complaint of polyarthralgia. Patient states       Past Medical History:  Diagnosis Date   Aortic atherosclerosis (HCC)    Bloody diarrhea 06/11/2011   Left sided colitis per CT. Colonoscopy with segmental colitis at 50 to 25 cm, propably ischemic.    Diabetes mellitus without complication ([place])    Diverticulosis    Enlarged prostate    Fatty liver 08/11/2015   Hypertension    Internal hemorrhoids    Nephrolithiasis    Obesity 08/11/2015   BMI 32.    OSA (obstructive sleep apnea) 04/12/2013   dx per nerologist in [place].    Peyronie disease    Tubular adenoma of colon    Past Surgical History:  Procedure Laterality Date   BILATERAL CARPAL TUNNEL RELEASE Bilateral    COLONOSCOPY  06/2011   [name], [place].  acute colitis at 50 to 25 cm, propably ischemic.  removed small rectal polyp (hyperplastic on path)    ESOPHAGOGASTRODUODENOSCOPY N/A 08/20/2015   Procedure: ESOPHAGOGASTRODUODENOSCOPY (EGD);  Surgeon: [name], MD;  Location: [facility];  Service: Endoscopy;  Laterality: N/A;   Social History   Socioeconomic History   Marital status: Single    Spouse name: Not on file   Number of children: Not on file   Years of education: Not on file   Highest education level: Not on file  Occupational History   Occupation: Maintance Tech  Tobacco Use   Smoking status: Every Day    Types: Cigarettes   Smokeless tobacco: Never  Vaping Use   Vaping Use: Never used  Substance and Sexual Activity   Alcohol use: Yes    Comment: rarely   Drug use: Never   Sexual activity: Yes  Other Topics Concern   Not on file  Social History Narrative   Not on file   Social Determinants of Health   Financial  Resource Strain: Not on file  Food Insecurity: Not on file  Transportation Needs: Not on file  Physical Activity: Not on file  Stress: Not on file  Social Connections: Not on file   Allergies  Allergen Reactions   Cephalexin Hives    Other reaction(s): ITCHING   Hydroxyzine Hcl Hives    Other reaction(s): ITCHING   Other Swelling    Other reaction(s): DIFFICULTY BREATHING   Shellfish Allergy Anaphylaxis    Other reaction(s): DIFFICULTY BREATHING   Hydrocodone-Acetaminophen Hives   Codeine    Erythromycin    Penicillins    Family History  Problem Relation Age of Onset   Hypertension Mother    Diabetes Father    Heart disease Father    Hypertension Father    Stroke Father    Mental retardation Father     Current Outpatient Medications (Endocrine & Metabolic):    JARDIANCE 25 MG TABS tablet, Take 25 mg by mouth daily.   metFORMIN (GLUCOPHAGE-XR) 500 MG 24 hr tablet, Take 1,000 mg by mouth 2 (two) times daily.  Current Outpatient Medications (Cardiovascular):    amLODipine (NORVASC) 5 MG tablet, Take 5 mg by mouth daily.   fenofibrate (TRICOR) 145 MG tablet, SMARTSIG:1 Tablet(s) By Mouth Every Evening   sildenafil (REVATIO) 20 MG tablet, Take by mouth.  Current Outpatient Medications (Respiratory):    Oxymetazoline HCl (NASAL SPRAY) 0.05 % SOLN, Place into the nose.  Current Outpatient Medications (Analgesics):    aspirin EC 81 MG tablet, Take 81 mg by mouth.   ibuprofen (ADVIL) 800 MG tablet, Take 1 tablet (800 mg total) by mouth every 8 (eight) hours as needed.   Current Outpatient Medications (Other):    blood glucose meter kit and supplies KIT, Dispense based on patient and insurance preference. Use up to four times daily as directed. (FOR ICD-9 250.00, 250.01).   diazepam (VALIUM) 5 MG tablet, Take 5 mg by mouth daily as needed.   dicyclomine (BENTYL) 20 MG tablet, Take 1 tablet (20 mg total) by mouth 3 (three) times daily as needed for spasms (this is longer  aciting, take to prevent).   glucose blood test strip, Use to test blood sugar up to 4 times daily.   hyoscyamine (LEVSIN) 0.125 MG tablet, Take 1 tablet (0.125 mg total) by mouth every 6 (six) hours as needed for cramping.   omeprazole (PRILOSEC) 40 MG capsule, Take 40 mg by mouth daily.   ondansetron (ZOFRAN) 4 MG tablet, Take 1 tablet (4 mg total) by mouth every 8 (eight) hours as needed for nausea or vomiting.   Polyethylene Glycol 3350 GRAN, Take by mouth.   Reviewed prior external information including notes and imaging from  primary care provider As well as notes that were available from care everywhere and other healthcare systems.  Past medical history, social, surgical and family history all reviewed in electronic medical record.  No pertanent information unless stated regarding to the chief complaint.   Review of Systems:  No headache, visual changes, nausea, vomiting, diarrhea, constipation, dizziness, abdominal pain, skin rash, fevers, chills, night sweats, weight loss, swollen lymph nodes, body aches, joint swelling, chest pain, shortness of breath, mood changes. POSITIVE muscle aches  Objective  There were no vitals taken for this visit.   General: No apparent distress alert and oriented x3 mood and affect normal, dressed appropriately.  HEENT: Pupils equal, extraocular movements intact  Respiratory: Patient's speak in full sentences and does not appear short of breath  Cardiovascular: No lower extremity edema, non tender, no erythema      Impression and Recommendations:

## 2022-06-09 ENCOUNTER — Ambulatory Visit: Payer: BC Managed Care – PPO | Admitting: Family Medicine

## 2022-06-10 DIAGNOSIS — R269 Unspecified abnormalities of gait and mobility: Secondary | ICD-10-CM | POA: Diagnosis not present

## 2022-06-10 DIAGNOSIS — M4302 Spondylolysis, cervical region: Secondary | ICD-10-CM | POA: Diagnosis not present

## 2022-06-10 DIAGNOSIS — Z6825 Body mass index (BMI) 25.0-25.9, adult: Secondary | ICD-10-CM | POA: Diagnosis not present

## 2022-06-10 DIAGNOSIS — M5136 Other intervertebral disc degeneration, lumbar region: Secondary | ICD-10-CM | POA: Diagnosis not present

## 2022-06-29 ENCOUNTER — Ambulatory Visit: Payer: BC Managed Care – PPO | Admitting: Internal Medicine

## 2022-07-07 DIAGNOSIS — M47816 Spondylosis without myelopathy or radiculopathy, lumbar region: Secondary | ICD-10-CM | POA: Diagnosis not present

## 2022-07-08 DIAGNOSIS — E119 Type 2 diabetes mellitus without complications: Secondary | ICD-10-CM | POA: Diagnosis not present

## 2022-07-21 DIAGNOSIS — M4302 Spondylolysis, cervical region: Secondary | ICD-10-CM | POA: Diagnosis not present

## 2022-07-21 DIAGNOSIS — R269 Unspecified abnormalities of gait and mobility: Secondary | ICD-10-CM | POA: Diagnosis not present

## 2022-07-21 DIAGNOSIS — M47816 Spondylosis without myelopathy or radiculopathy, lumbar region: Secondary | ICD-10-CM | POA: Diagnosis not present

## 2022-07-27 DIAGNOSIS — I10 Essential (primary) hypertension: Secondary | ICD-10-CM | POA: Diagnosis not present

## 2022-07-27 DIAGNOSIS — F330 Major depressive disorder, recurrent, mild: Secondary | ICD-10-CM | POA: Diagnosis not present

## 2022-07-27 DIAGNOSIS — E1169 Type 2 diabetes mellitus with other specified complication: Secondary | ICD-10-CM | POA: Diagnosis not present

## 2022-07-27 DIAGNOSIS — E785 Hyperlipidemia, unspecified: Secondary | ICD-10-CM | POA: Diagnosis not present

## 2022-10-18 ENCOUNTER — Encounter: Payer: Self-pay | Admitting: Neurology

## 2022-10-18 ENCOUNTER — Ambulatory Visit: Payer: BC Managed Care – PPO | Admitting: Neurology

## 2022-10-18 VITALS — BP 123/76 | HR 66 | Ht 72.0 in | Wt 187.5 lb

## 2022-10-18 DIAGNOSIS — G8929 Other chronic pain: Secondary | ICD-10-CM | POA: Diagnosis not present

## 2022-10-18 DIAGNOSIS — M5440 Lumbago with sciatica, unspecified side: Secondary | ICD-10-CM | POA: Diagnosis not present

## 2022-10-18 DIAGNOSIS — R269 Unspecified abnormalities of gait and mobility: Secondary | ICD-10-CM

## 2022-10-18 NOTE — Progress Notes (Signed)
Chief Complaint  Patient presents with   New Patient (Initial Visit)    Rm 14. Patient Alone. Patient was sent by spine Dr. Nicki Reaper burning sensations in waist, and arm pains and leg pains as well with burning and tingling.        ASSESSMENT AND PLAN  Steve Ritter is a 57 y.o. 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   MEDICAL HISTORY:  Steve Ritter, is a 57 year old male, seen in request by his primary care physician Dr. For evaluation [name], [name], bilateral feet paresthesia, initial evaluation was on December 02, 2020   I reviewed and summarized the referring note. PMHX. DM since 2018. HTN OSA, uncomfortable.  He began to have bilateral feet numbness tingling since 2020, initially only involving bottom of his feet, now becoming intense, still stay at the plantar surface, but often feels discomfort after weightbearing  He had a history of low back surgery in the past, has frequent low back pain, occasionally will wake right lower extremity, he denies bowel and bladder incontinence,  Update February 04, 2021:  He return for electrodiagnostic study, no evidence of large fiber peripheral neuropathy, mild right lumbar radiculopathy mainly involving right L4-5, no evidence of active process  Review of the laboratory evaluation  in August 2022: Normal or negative HIV, RPR, B12, TSH, C-reactive protein, ESR, elevated A1c 7.4, positive ANA, with positive SSB antibody, normal CBC, mildly decreased vitamin D 22  He was started on Cymbalta 60 mg along with the previous Lyrica 75 twice a day, complains of difficulty starting urine, Cymbalta dosage was decreased to 30 mg, the side effect has subsided, the combination did help his symptoms better, he complains of numbness, sensitivity at the bottom of his feet, felt cushion when he bearing weight,  Had a history of lumbar decompression surgery, reported right lumbar radiculopathy prior to surgery, only occasionally flareup of low back pain, no gait abnormality, no bowel bladder incontinence  UPDATE July 8th 2024: Since beginning of 2024, without clear triggers, he experienced worsening low back pain on the background of chronic issues, now he complains of radiating pain to bilateral posterior thigh and occasionally to left groin area, this is coincident with his new diabetic medications Ozempic, seen by GI physician, no etiology found,  He also complains of gradual onset mildly unsteady gait, denies bowel or bladder incontinence  Personally reviewed MRI cervical from February 2024, multilevel degenerative changes, most noticeable C4-5, mild flattening of the ventral aspect of the spinal cord, but no significant canal stenosis, no cord signal abnormality  MRI of lumbar January 2024, TLIF at L4-5 L5-S1, residual mild left lateral recess narrowing at L4-5, disc bulging with facet hypertrophy at L3-4 with mild left lateral recess narrowing,    PHYSICAL EXAM:   Vitals:   10/18/22 0841  BP: 123/76  Pulse: 66  Weight: 187 lb 8 oz (85 kg)  Height: 6' (1.829 m)  Not recorded     Body mass index is 25.43 kg/m.  PHYSICAL EXAMNIATION:  Gen: NAD, conversant, well nourised, well groomed      NEUROLOGICAL EXAM:  MENTAL STATUS: Speech/cognition: Awake, alert, oriented to  history taking and casual conversation   CRANIAL NERVES: CN II: Visual fields are full to confrontation. Pupils are round equal and briskly reactive to light. CN III, IV, VI: extraocular movement are normal. No ptosis. CN V: Facial sensation is intact to light touch CN VII: Face is symmetric with normal eye closure  CN VIII: Hearing is normal to causal conversation. CN IX, X: Phonation is normal. CN XI: Head turning and shoulder shrug are intact  MOTOR: Mild right toe extension, flexion weakness  REFLEXES: Reflexes are 1 and symmetric at the biceps, triceps, 2/2 knees, and absent at ankles. Plantar responses are flexor.  SENSORY: Decreased vibratory sensation in toes, mildly length dependent decreased pinprick to ankle level  COORDINATION: There is no trunk or limb dysmetria noted.  GAIT/STANCE: Push-up to get up from seated position, cautious, slight difficulty with right heels, normal tiptoe  REVIEW OF SYSTEMS:  Full 14 system review of systems performed and notable only for as above All other review of systems were negative.   ALLERGIES: Allergies  Allergen Reactions   Cephalexin Hives    Other reaction(s): ITCHING   Hydroxyzine Hcl Hives    Other reaction(s): ITCHING   Other Swelling    Other reaction(s): DIFFICULTY BREATHING   Shellfish Allergy Anaphylaxis    Other reaction(s): DIFFICULTY BREATHING   Hydrocodone-Acetaminophen Hives   Codeine    Erythromycin    Penicillins     HOME MEDICATIONS: Current Outpatient Medications  Medication Sig Dispense Refill   amLODipine (NORVASC) 5 MG tablet Take 5 mg by mouth daily.  3   aspirin EC 81 MG tablet Take 81 mg by mouth.     blood glucose meter kit and supplies KIT Dispense based on patient and insurance preference. Use up to four times daily as directed. (FOR ICD-9 250.00, 250.01). 1 each 0   diazepam (VALIUM) 5 MG tablet Take 5 mg by mouth daily as needed.     dicyclomine (BENTYL) 20 MG tablet Take 1 tablet (20 mg  total) by mouth 3 (three) times daily as needed for spasms (this is longer aciting, take to prevent). 30 tablet 0   fenofibrate (TRICOR) 145 MG tablet SMARTSIG:1 Tablet(s) By Mouth Every Evening     glucose blood test strip Use to test blood sugar up to 4 times daily. 400 each 3   hyoscyamine (LEVSIN) 0.125 MG tablet Take 1 tablet (0.125 mg total) by mouth every 6 (six) hours as needed for cramping. 20 tablet 0   ibuprofen (ADVIL) 800 MG tablet Take 1 tablet (800 mg total) by mouth every 8 (eight) hours as needed. 30 tablet 2   JARDIANCE 25 MG TABS tablet Take 25 mg by mouth daily.  4   metFORMIN (GLUCOPHAGE-XR) 500 MG 24 hr tablet Take 1,000 mg by mouth 2 (two) times daily.     omeprazole (PRILOSEC) 40 MG capsule Take 40 mg by mouth daily.     ondansetron (ZOFRAN) 4 MG tablet Take 1 tablet (4 mg total) by mouth every 8 (eight) hours as needed for nausea or vomiting. 20 tablet 0   Oxymetazoline HCl (NASAL SPRAY) 0.05 % SOLN Place into the nose.     Polyethylene Glycol 3350 GRAN Take by mouth.     sildenafil (REVATIO) 20 MG tablet Take  by mouth.     No current facility-administered medications for this visit.    PAST MEDICAL HISTORY: Past Medical History:  Diagnosis Date   Aortic atherosclerosis (HCC)    Bloody diarrhea 06/11/2011   Left sided colitis per CT. Colonoscopy with segmental colitis at 50 to 25 cm, propably ischemic.    Diabetes mellitus without complication (HCC)    Diverticulosis    Enlarged prostate    Fatty liver 08/11/2015   Hypertension    Internal hemorrhoids    Nephrolithiasis    Obesity 08/11/2015   BMI 32.    OSA (obstructive sleep apnea) 04/12/2013   dx per nerologist in [place].    Peyronie disease    Tubular adenoma of colon     PAST SURGICAL HISTORY: Past Surgical History:  Procedure Laterality Date   BILATERAL CARPAL TUNNEL RELEASE Bilateral    COLONOSCOPY  06/2011   Dr Lincoln Maxin, [place].  acute colitis at 50 to 25 cm, propably ischemic.   removed small rectal polyp (hyperplastic on path)    ESOPHAGOGASTRODUODENOSCOPY N/A 08/20/2015   Procedure: ESOPHAGOGASTRODUODENOSCOPY (EGD);  Surgeon: Beverley Fiedler, MD;  Location: [facility];  Service: Endoscopy;  Laterality: N/A;    FAMILY HISTORY: Family History  Problem Relation Age of Onset   Hypertension Mother    Diabetes Father    Heart disease Father    Hypertension Father    Stroke Father    Mental retardation Father     SOCIAL HISTORY: Social History   Socioeconomic History   Marital status: Single    Spouse name: Not on file   Number of children: Not on file   Years of education: Not on file   Highest education level: Not on file  Occupational History   Occupation: Maintance Tech  Tobacco Use   Smoking status: Every Day    Types: Cigarettes   Smokeless tobacco: Never  Vaping Use   Vaping Use: Never used  Substance and Sexual Activity   Alcohol use: Yes    Comment: rarely   Drug use: Never   Sexual activity: Yes  Other Topics Concern   Not on file  Social History Narrative   Not on file   Social Determinants of Health   Financial Resource Strain: Not on file  Food Insecurity: Not on file  Transportation Needs: Not on file  Physical Activity: Not on file  Stress: Not on file  Social Connections: Not on file  Intimate Partner Violence: Not on file      Levert Feinstein, M.D. Ph.D.  [facility] [address] [place], [place] [address] Ph: [phone] Fax: [phone]  CC:  Theda Sers, [place] [address] [place],  [place] [address]  Alysia Penna, MD

## 2022-10-19 ENCOUNTER — Telehealth: Payer: Self-pay | Admitting: Neurology

## 2022-10-19 NOTE — Telephone Encounter (Signed)
Pt scheduled for 30 mins MR thoracic spine wo contrast at [facility] for 10/26/22 at [facility] auth # [id number] (10/18/22-11/16/22)

## 2022-10-25 DIAGNOSIS — E1169 Type 2 diabetes mellitus with other specified complication: Secondary | ICD-10-CM | POA: Diagnosis not present

## 2022-10-25 DIAGNOSIS — I10 Essential (primary) hypertension: Secondary | ICD-10-CM | POA: Diagnosis not present

## 2022-10-26 ENCOUNTER — Ambulatory Visit (INDEPENDENT_AMBULATORY_CARE_PROVIDER_SITE_OTHER): Payer: BC Managed Care – PPO

## 2022-10-26 ENCOUNTER — Encounter: Payer: Self-pay | Admitting: Neurology

## 2022-10-26 DIAGNOSIS — G8929 Other chronic pain: Secondary | ICD-10-CM | POA: Diagnosis not present

## 2022-10-26 DIAGNOSIS — R269 Unspecified abnormalities of gait and mobility: Secondary | ICD-10-CM | POA: Diagnosis not present

## 2022-10-26 DIAGNOSIS — M5440 Lumbago with sciatica, unspecified side: Secondary | ICD-10-CM | POA: Diagnosis not present

## 2022-11-08 ENCOUNTER — Telehealth: Payer: Self-pay | Admitting: Neurology

## 2022-11-08 NOTE — Telephone Encounter (Signed)
Pt called and LVM requesting RN to call back with MRI results. Please advise.

## 2022-12-20 ENCOUNTER — Telehealth: Payer: Self-pay | Admitting: Neurology

## 2022-12-20 NOTE — Telephone Encounter (Signed)
noted 

## 2022-12-20 NOTE — Telephone Encounter (Signed)
REQUIRED PHONE NOTE: pt has called to r/s an appointment scheduled for 12-21-22

## 2022-12-21 ENCOUNTER — Ambulatory Visit: Payer: BC Managed Care – PPO | Admitting: Neurology

## 2023-01-10 ENCOUNTER — Ambulatory Visit: Payer: BC Managed Care – PPO | Admitting: Neurology

## 2023-01-14 IMAGING — CT CT ABD-PELV W/ CM
2 of 5 series · 16 of 46 positions shown, 18 images · IV contrast (OMNIPAQUE)
Comparison: 10/06/2015 and 08/17/2015

CLINICAL DATA: Left upper quadrant and left lower quadrant pain.
Microscopic hematuria.

EXAM:
CT ABDOMEN AND PELVIS WITH CONTRAST
TECHNIQUE: Multidetector CT imaging of the abdomen and pelvis was performed
using the standard protocol following bolus administration of
intravenous contrast.

[Series 2: axial st · axial · 0.85mm/px · z∈[-533,-93]mm · 13 of 102 slices shown, 15 images]
[im 7/102  soft-tissue]
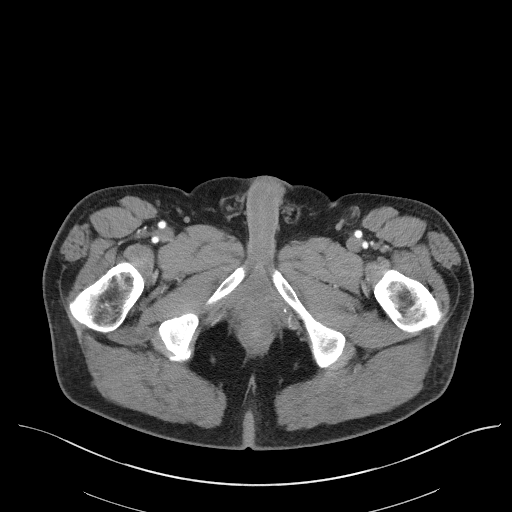
[im 7/102  bone]
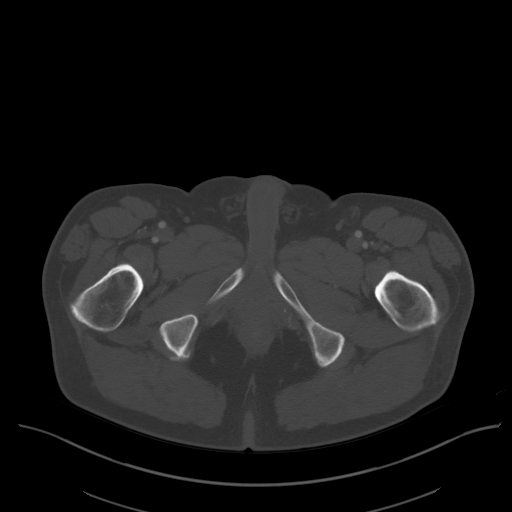
[im 14/102  soft-tissue]
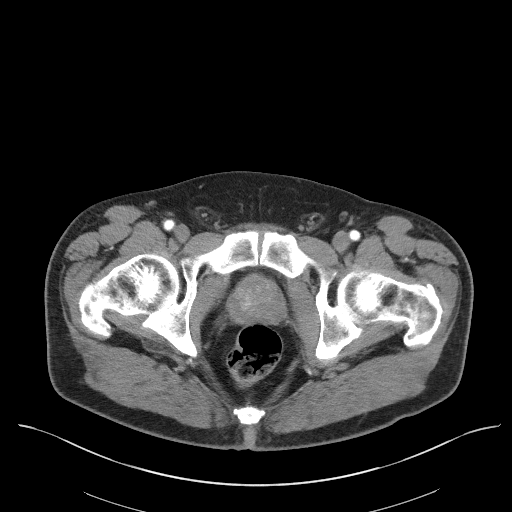
[im 21/102  soft-tissue]
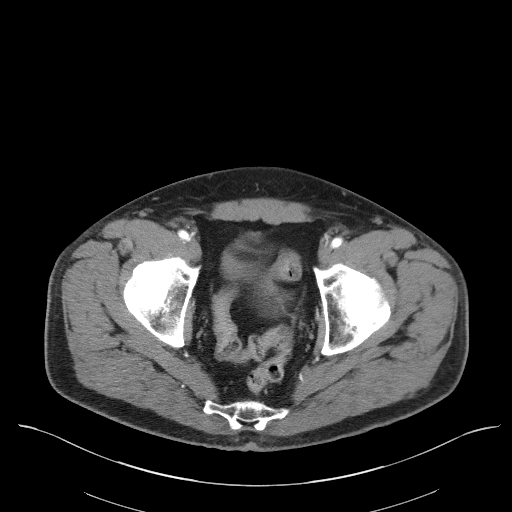
[im 27/102  soft-tissue]
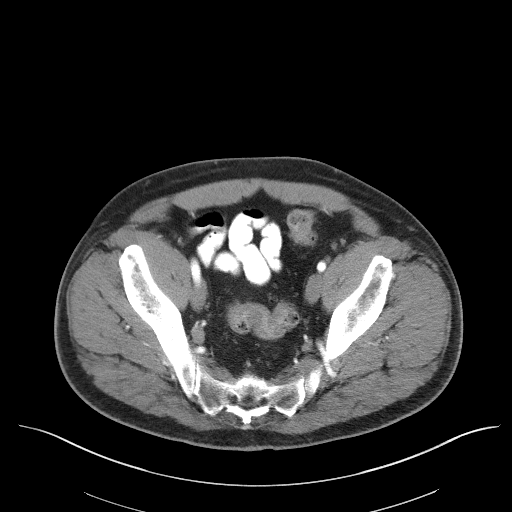
[im 34/102  soft-tissue]
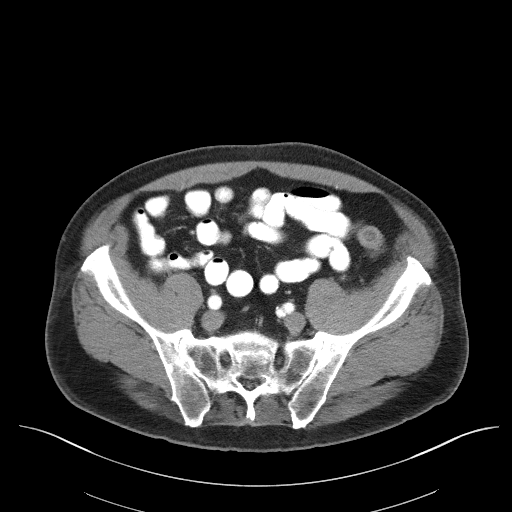
[im 41/102  soft-tissue]
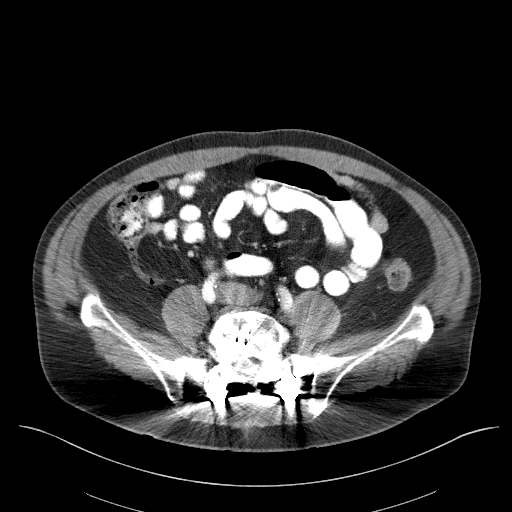
[im 54/102  soft-tissue]
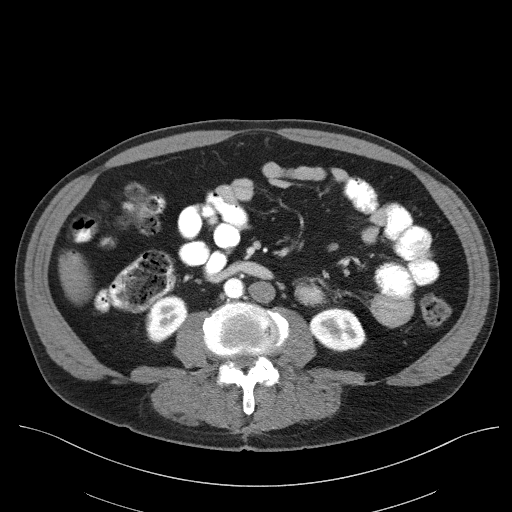
[im 61/102  soft-tissue]
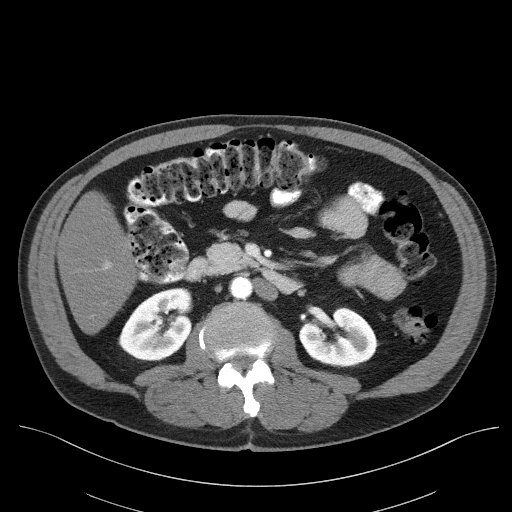
[im 68/102  soft-tissue]
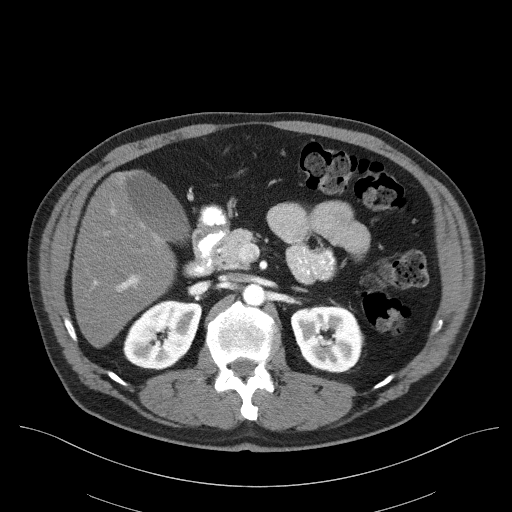
[im 68/102  bone]
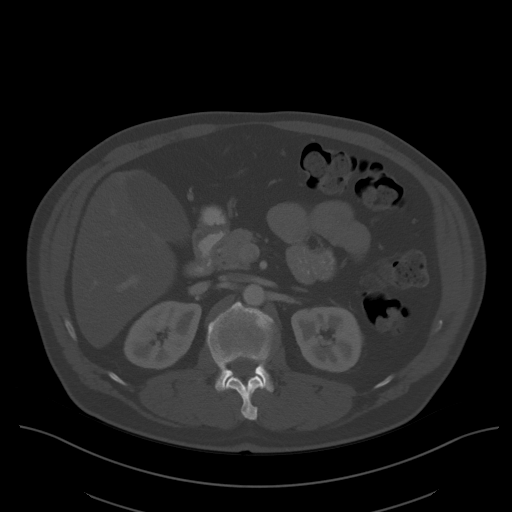
[im 75/102  soft-tissue]
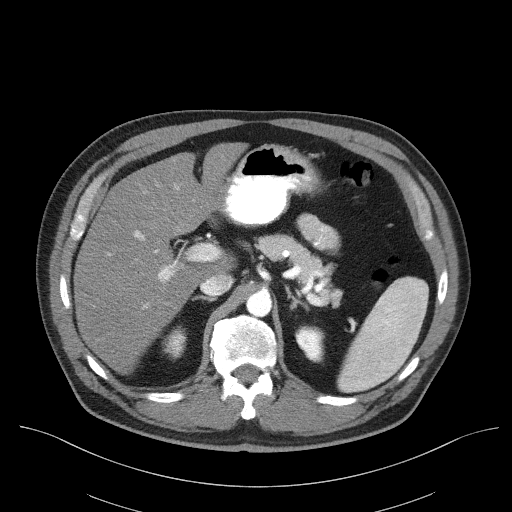
[im 81/102  soft-tissue]
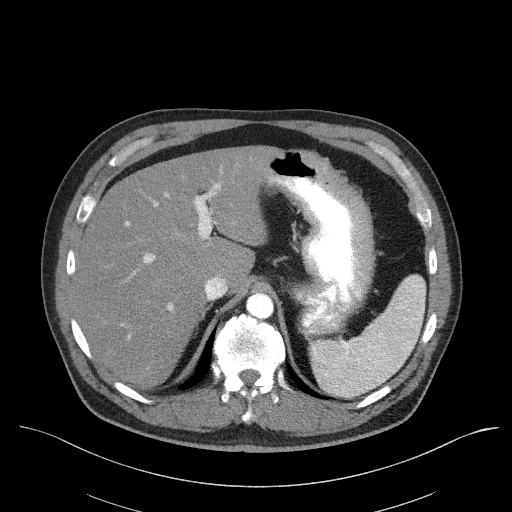
[im 88/102  soft-tissue]
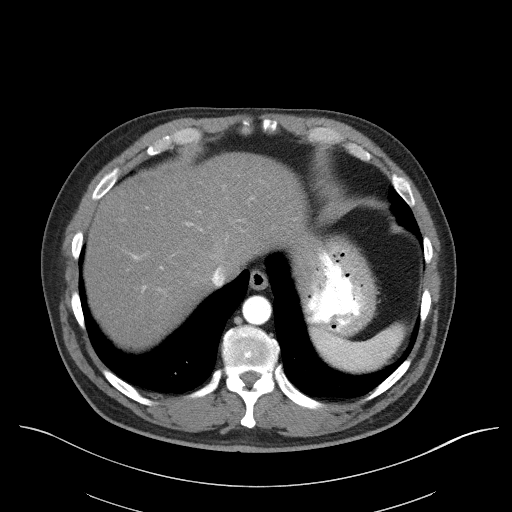
[im 95/102  soft-tissue]
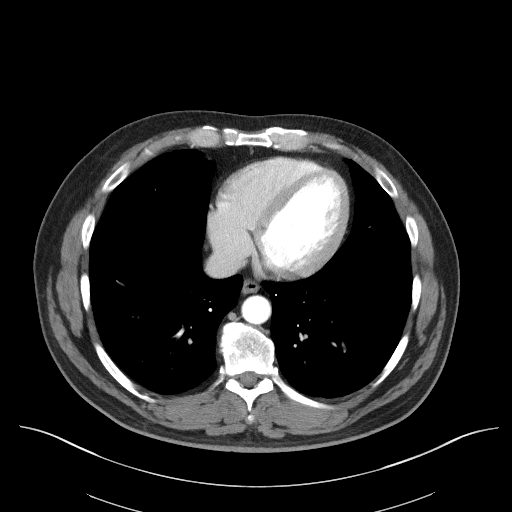

[Series 4: coronal st · coronal · 0.87mm/px · 3 of 103 slices shown]
[im 35/103  soft-tissue]
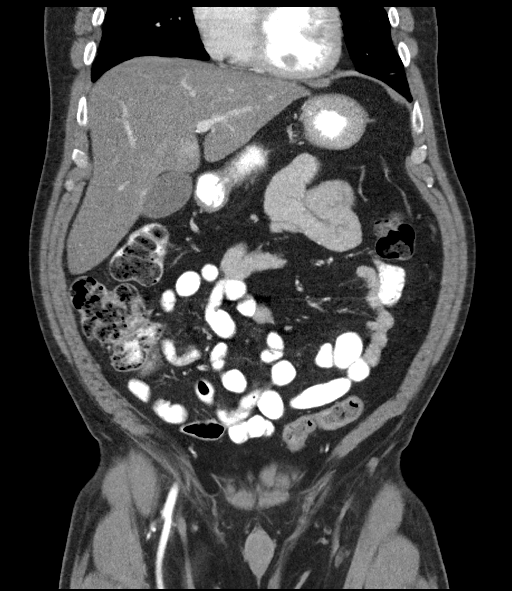
[im 46/103  soft-tissue]
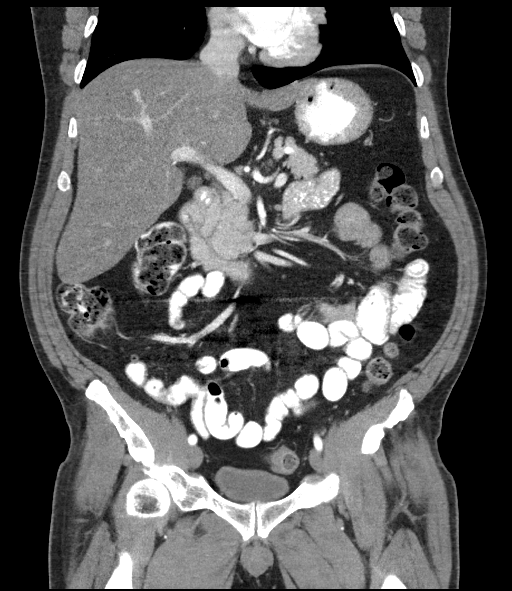
[im 57/103  soft-tissue]
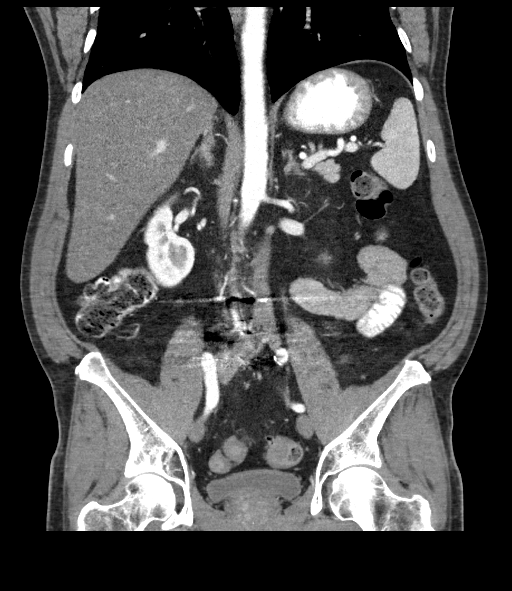

[16 of 46 positions shown; findings below may reference images not displayed]

RADIATION DOSE REDUCTION: This exam was performed according to the
departmental dose-optimization program which includes automated
exposure control, adjustment of the mA and/or kV according to
patient size and/or use of iterative reconstruction technique.

CONTRAST:  100mL OMNIPAQUE IOHEXOL 300 MG/ML  SOLN
FINDINGS: Lower Chest: No acute findings.

Hepatobiliary: No hepatic masses identified. Moderate diffuse
hepatic steatosis is increased since previous study. Gallbladder is
unremarkable. No evidence of biliary ductal dilatation.

Pancreas:  No mass or inflammatory changes.

Spleen: Within normal limits in size and appearance.

Adrenals/Urinary Tract: No masses identified. 5 mm calculus is again
seen in lower pole of right kidney. No evidence of ureteral calculi
or hydronephrosis.

Stomach/Bowel: No evidence of obstruction, inflammatory process or
abnormal fluid collections. Normal appendix visualized.

Vascular/Lymphatic: No pathologically enlarged lymph nodes. No acute
vascular findings. Aortic atherosclerotic calcification noted.
Congenital left-sided IVC again noted.

Reproductive:  Mildly enlarged prostate gland.

Other:  None.

Musculoskeletal: No suspicious bone lesions identified. Lumbar spine
fusion hardware noted at L4-5 and L5-S1.
IMPRESSION: 5 mm right renal calculus. No evidence of ureteral calculi or
hydronephrosis.

Moderate hepatic steatosis, increased since prior study.

Mildly enlarged prostate.

Aortic Atherosclerosis ([id number]).

## 2023-01-17 DIAGNOSIS — E1169 Type 2 diabetes mellitus with other specified complication: Secondary | ICD-10-CM | POA: Diagnosis not present

## 2023-01-17 DIAGNOSIS — E785 Hyperlipidemia, unspecified: Secondary | ICD-10-CM | POA: Diagnosis not present

## 2023-01-17 DIAGNOSIS — E559 Vitamin D deficiency, unspecified: Secondary | ICD-10-CM | POA: Diagnosis not present

## 2023-01-17 DIAGNOSIS — I10 Essential (primary) hypertension: Secondary | ICD-10-CM | POA: Diagnosis not present

## 2023-01-17 DIAGNOSIS — R972 Elevated prostate specific antigen [PSA]: Secondary | ICD-10-CM | POA: Diagnosis not present

## 2023-01-24 DIAGNOSIS — E1169 Type 2 diabetes mellitus with other specified complication: Secondary | ICD-10-CM | POA: Diagnosis not present

## 2023-01-24 DIAGNOSIS — I10 Essential (primary) hypertension: Secondary | ICD-10-CM | POA: Diagnosis not present

## 2023-01-24 DIAGNOSIS — Z1331 Encounter for screening for depression: Secondary | ICD-10-CM | POA: Diagnosis not present

## 2023-01-24 DIAGNOSIS — Z1339 Encounter for screening examination for other mental health and behavioral disorders: Secondary | ICD-10-CM | POA: Diagnosis not present

## 2023-01-24 DIAGNOSIS — R82998 Other abnormal findings in urine: Secondary | ICD-10-CM | POA: Diagnosis not present

## 2023-01-24 DIAGNOSIS — Z0000 Encounter for general adult medical examination without abnormal findings: Secondary | ICD-10-CM | POA: Diagnosis not present

## 2023-02-08 DIAGNOSIS — R918 Other nonspecific abnormal finding of lung field: Secondary | ICD-10-CM | POA: Diagnosis not present

## 2023-02-08 DIAGNOSIS — Z122 Encounter for screening for malignant neoplasm of respiratory organs: Secondary | ICD-10-CM | POA: Diagnosis not present

## 2023-02-24 ENCOUNTER — Ambulatory Visit: Payer: BC Managed Care – PPO | Admitting: Neurology

## 2023-03-28 ENCOUNTER — Telehealth: Payer: Self-pay | Admitting: Neurology

## 2023-03-28 NOTE — Telephone Encounter (Signed)
 MYC confirmation

## 2023-03-29 ENCOUNTER — Ambulatory Visit: Payer: BC Managed Care – PPO | Admitting: Neurology

## 2023-04-29 DIAGNOSIS — J189 Pneumonia, unspecified organism: Secondary | ICD-10-CM | POA: Diagnosis not present

## 2023-04-29 DIAGNOSIS — R059 Cough, unspecified: Secondary | ICD-10-CM | POA: Diagnosis not present

## 2023-04-29 DIAGNOSIS — R0981 Nasal congestion: Secondary | ICD-10-CM | POA: Diagnosis not present

## 2023-08-03 DIAGNOSIS — E119 Type 2 diabetes mellitus without complications: Secondary | ICD-10-CM | POA: Diagnosis not present

## 2023-08-08 DIAGNOSIS — I10 Essential (primary) hypertension: Secondary | ICD-10-CM | POA: Diagnosis not present

## 2023-08-08 DIAGNOSIS — E1169 Type 2 diabetes mellitus with other specified complication: Secondary | ICD-10-CM | POA: Diagnosis not present

## 2024-01-23 DIAGNOSIS — E785 Hyperlipidemia, unspecified: Secondary | ICD-10-CM | POA: Diagnosis not present

## 2024-01-23 DIAGNOSIS — Z0189 Encounter for other specified special examinations: Secondary | ICD-10-CM | POA: Diagnosis not present

## 2024-01-23 DIAGNOSIS — E559 Vitamin D deficiency, unspecified: Secondary | ICD-10-CM | POA: Diagnosis not present

## 2024-01-23 DIAGNOSIS — E1169 Type 2 diabetes mellitus with other specified complication: Secondary | ICD-10-CM | POA: Diagnosis not present

## 2024-01-30 DIAGNOSIS — Z1331 Encounter for screening for depression: Secondary | ICD-10-CM | POA: Diagnosis not present

## 2024-01-30 DIAGNOSIS — F17200 Nicotine dependence, unspecified, uncomplicated: Secondary | ICD-10-CM | POA: Diagnosis not present

## 2024-01-30 DIAGNOSIS — Z0000 Encounter for general adult medical examination without abnormal findings: Secondary | ICD-10-CM | POA: Diagnosis not present

## 2024-01-30 DIAGNOSIS — I10 Essential (primary) hypertension: Secondary | ICD-10-CM | POA: Diagnosis not present

## 2024-01-30 DIAGNOSIS — Z1339 Encounter for screening examination for other mental health and behavioral disorders: Secondary | ICD-10-CM | POA: Diagnosis not present

## 2024-02-02 DIAGNOSIS — M25521 Pain in right elbow: Secondary | ICD-10-CM | POA: Diagnosis not present

## 2024-02-10 DIAGNOSIS — M7021 Olecranon bursitis, right elbow: Secondary | ICD-10-CM | POA: Diagnosis not present

## 2024-02-10 DIAGNOSIS — M19021 Primary osteoarthritis, right elbow: Secondary | ICD-10-CM | POA: Diagnosis not present

## 2024-02-10 DIAGNOSIS — M778 Other enthesopathies, not elsewhere classified: Secondary | ICD-10-CM | POA: Diagnosis not present

## 2024-03-06 DIAGNOSIS — M7021 Olecranon bursitis, right elbow: Secondary | ICD-10-CM | POA: Diagnosis not present

## 2024-03-06 DIAGNOSIS — M778 Other enthesopathies, not elsewhere classified: Secondary | ICD-10-CM | POA: Diagnosis not present

## 2024-03-06 DIAGNOSIS — M19021 Primary osteoarthritis, right elbow: Secondary | ICD-10-CM | POA: Diagnosis not present
